# Patient Record
Sex: Female | Born: 1990
Health system: Southern US, Community
[De-identification: ages and names within clinical notes are randomized; demographics above are authoritative.]

## PROBLEM LIST (undated history)

## (undated) DIAGNOSIS — J45909 Unspecified asthma, uncomplicated: Secondary | ICD-10-CM

## (undated) DIAGNOSIS — E0590 Thyrotoxicosis, unspecified without thyrotoxic crisis or storm: Secondary | ICD-10-CM

## (undated) HISTORY — PX: TONSILLECTOMY: SUR1361

## (undated) HISTORY — PX: WISDOM TOOTH EXTRACTION: SHX21

## (undated) HISTORY — DX: Unspecified asthma, uncomplicated: J45.909

---

## 2017-10-20 DIAGNOSIS — R52 Pain, unspecified: Secondary | ICD-10-CM | POA: Diagnosis not present

## 2018-04-05 DIAGNOSIS — Z01419 Encounter for gynecological examination (general) (routine) without abnormal findings: Secondary | ICD-10-CM | POA: Diagnosis not present

## 2018-08-04 DIAGNOSIS — Z3169 Encounter for other general counseling and advice on procreation: Secondary | ICD-10-CM | POA: Diagnosis not present

## 2018-10-14 ENCOUNTER — Ambulatory Visit (INDEPENDENT_AMBULATORY_CARE_PROVIDER_SITE_OTHER): Payer: BLUE CROSS/BLUE SHIELD | Admitting: Physician Assistant

## 2018-10-14 ENCOUNTER — Ambulatory Visit (INDEPENDENT_AMBULATORY_CARE_PROVIDER_SITE_OTHER): Payer: BLUE CROSS/BLUE SHIELD

## 2018-10-14 ENCOUNTER — Encounter: Payer: Self-pay | Admitting: Physician Assistant

## 2018-10-14 ENCOUNTER — Other Ambulatory Visit: Payer: Self-pay

## 2018-10-14 VITALS — BP 102/67 | HR 67 | Ht 67.0 in | Wt 141.0 lb

## 2018-10-14 DIAGNOSIS — J45990 Exercise induced bronchospasm: Secondary | ICD-10-CM | POA: Diagnosis not present

## 2018-10-14 DIAGNOSIS — Z7689 Persons encountering health services in other specified circumstances: Secondary | ICD-10-CM | POA: Diagnosis not present

## 2018-10-14 DIAGNOSIS — Z23 Encounter for immunization: Secondary | ICD-10-CM

## 2018-10-14 DIAGNOSIS — R2241 Localized swelling, mass and lump, right lower limb: Secondary | ICD-10-CM

## 2018-10-14 DIAGNOSIS — L539 Erythematous condition, unspecified: Secondary | ICD-10-CM | POA: Diagnosis not present

## 2018-10-14 NOTE — Assessment & Plan Note (Addendum)
This is been present for years, on and off even will become extremely erythematous, swollen without much pain. She has been admitted for this in the past she has had courses of antibiotics without much improvement. This is definitely not a paronychia. I do think that this is more of a vascular phenomenon, possibly a subcutaneous vascular hemangioma. Adding an x-ray, and an MRI of the right third toe with IV contrast.

## 2018-10-14 NOTE — Patient Instructions (Signed)
Vascular Malformation    A vascular malformation is a defect in the development of a blood or lymph vessel. Vascular malformations are present at birth, but they may not cause signs or symptoms until years later. Vascular malformations can occur anywhere that blood or lymph circulates in your body. Lymph is fluid that is part of the body's disease-fighting (immune) system.  There are four types of vascular malformations.  Venous malformations  These affect the veins, which are blood vessels that carry blood back to the heart. These are the most common type.  Arteriovenous malformations  These involve veins and arteries. Arteries are blood vessels that carry oxygen-rich blood away from the heart.  Lymphatic malformations  These affect lymph vessels. These vessels carry fluid through the body that helps to fight infections.  Capillary malformations  These affect capillaries, which are tiny blood vessels that connect veins to arteries.  What are the causes?  This condition is caused by an error in development that occurs before birth. In most cases, the cause of the error is not known. In some cases, it may be caused by a change in a gene (mutation). These mutations can be passed down through families (inherited).  What are the signs or symptoms?  The main symptom of this condition is a skin blemish or lump that swells, bleeds, or is painful. Other signs and symptoms depend on the type, location, and size of the malformation.   Venous malformations. These commonly appear on the face, arms, legs, or the area between the neck and groin (torso). They may have a bluish appearance.   Arteriovenous malformations. These have different symptoms depending on where they grow in the body.  ? Brain malformations may cause a headache or seizure. They can also cause weakness, dizziness, confusion, loss of speech, or other symptoms of a bleeding (hemorrhagic) stroke. These malformations can be life-threatening.  ? Spinal cord  malformations may cause back pain, weakness, or inability to feel or move (paralysis).  ? Chest malformations may cause shortness of breath, fatigue, a cough, or coughing up blood.  ? Malformations in the head or neck may cause vision changes or difficulty with breathing or swallowing.   Lymphatic malformations. These cause swelling on the face and neck.   Capillary malformations. These may look like a purple stain (port-wine stain) on the face. Over time, the stain may become darker and the skin may become thicker.  How is this diagnosed?  This condition may be diagnosed based on:   A physical exam. Some malformations can be seen or felt on the body.   Tests that create detailed images of the body. These may include:  ? Ultrasound. This test uses sound waves to make images.  ? MRI. This test is used to diagnose deep vascular malformations.  ? CT scan. This uses X-rays to make a three-dimensional image.  ? Angiogram. This is a type of X-ray that is taken after a dye is injected into an artery. The dye travels to the malformation. This imaging is most important for diagnosing arteriovenous malformations.  How is this treated?  Treatment depends on the type, size, and location of the malformation as well as on any symptoms. In some cases, treatment may not be needed. The malformation will be checked often to see if it is growing. If treatment is needed, the options include:   Laser treatment. This may be used to treat malformations near the skin. This is often the best treatment for capillary malformations.     Embolization. This treatment uses a glue substance or a particle that is inserted into a blood vessel that supplies a malformation. This blocks blood flow and shrinks the malformation.   Sclerotherapy. This involves inserting an irritating substance into a blood vessel or lymphatic vessel. This causes the vessel to shrink and become a scar.   Radiosurgery. This is an X-ray treatment to destroy a specific  spot or to shrink a malformation. You may have this treatment to treat arteriovenous malformations in the brain or spinal cord.   Surgery. Some vascular malformations can be removed surgically. You may have other treatments to shrink the malformation before surgery.  Follow these instructions at home:   Take over-the-counter and prescription medicines only as told by your health care provider.   Return to your normal activities as told by your health care provider. Ask your health care provider what activities are safe for you.   Keep all follow-up visits as told by your health care provider. This is important.  Contact a health care provider if:   You have a fever.   You have pain.   You have a vascular malformation near your skin that changes size, bleeds, or becomes painful.   You have difficulty swallowing.   You have a headache, backache, or loss of feeling (numbness).   You have a cough.   You have dizziness, weakness, or confusion.  Get help right away if:     You have trouble breathing.   You have severe bleeding.   You cough up blood.   You have a severe headache or back pain.   You have loss of movement.   You have any symptoms of a stroke. "BE FAST" is an easy way to remember the main warning signs of a stroke:  ? B - Balance. Signs are dizziness, sudden trouble walking, or loss of balance.  ? E - Eyes. Signs are trouble seeing or a sudden change in vision.  ? F - Face. Signs are sudden weakness or numbness of the face, or the face or eyelid drooping on one side.  ? A - Arms. Signs are weakness or numbness in an arm. This happens suddenly and usually on one side of the body.  ? S - Speech. Signs are sudden trouble speaking, slurred speech, or trouble understanding what people say.  ? T - Time. Time to call emergency services. Write down what time symptoms started.   You have other signs of a stroke, such as:  ? A sudden, severe headache with no known cause.  ? Nausea or  vomiting.  ? Seizure.  Summary   A vascular malformation is an abnormal development of a blood or lymph vessel that you are born with.   Symptoms depend on the type, size, and location of the malformation that you have.   You might not develop symptoms for many years.   Imaging studies are needed to diagnose vascular malformations inside the body.   There are many options for treatment. The best treatment will depend on the type, size, symptoms, and location of the malformation.  This information is not intended to replace advice given to you by your health care provider. Make sure you discuss any questions you have with your health care provider.  Document Released: 04/15/2017 Document Revised: 04/15/2017 Document Reviewed: 04/15/2017  Elsevier Interactive Patient Education  2019 Elsevier Inc[employer]

## 2018-10-14 NOTE — Progress Notes (Signed)
Subjective:    I'm seeing this patient as a consultation for: Gena Fray, PA-C  CC: Toe swelling  HPI: For years this pleasant 28 year old female has had swelling in her right third toe, she has been in the hospital, had antibiotics several times.  For the most part, swelling and redness is somewhat intermittent.  Symptoms are moderate, persistent, localized without radiation.  There is absolutely no pain.  I reviewed the past medical history, family history, social history, surgical history, and allergies today and no changes were needed.  Please see the problem list section below in epic for further details.  Past Medical History: Past Medical History:  Diagnosis Date  . Asthma    Past Surgical History: Past Surgical History:  Procedure Laterality Date  . TONSILLECTOMY     Social History: Social History   Socioeconomic History  . Marital status: Married    Spouse name: Not on file  . Number of children: Not on file  . Years of education: Not on file  . Highest education level: Not on file  Occupational History  . Not on file  Social Needs  . Financial resource strain: Not on file  . Food insecurity:    Worry: Not on file    Inability: Not on file  . Transportation needs:    Medical: Not on file    Non-medical: Not on file  Tobacco Use  . Smoking status: Never Smoker  . Smokeless tobacco: Never Used  Substance and Sexual Activity  . Alcohol use: Yes    Alcohol/week: 4.0 standard drinks    Types: 4 Standard drinks or equivalent per week  . Drug use: Never  . Sexual activity: Yes    Birth control/protection: None    Comment: planning on pregnancy 10/2018  Lifestyle  . Physical activity:    Days per week: Not on file    Minutes per session: Not on file  . Stress: Not on file  Relationships  . Social connections:    Talks on phone: Not on file    Gets together: Not on file    Attends religious service: Not on file    Active member of club or  organization: Not on file    Attends meetings of clubs or organizations: Not on file    Relationship status: Not on file  Other Topics Concern  . Not on file  Social History Narrative  . Not on file   Family History: Family History  Problem Relation Age of Onset  . Heart attack Mother    Allergies: No Known Allergies Medications: See med rec.  Review of Systems: No headache, visual changes, nausea, vomiting, diarrhea, constipation, dizziness, abdominal pain, skin rash, fevers, chills, night sweats, weight loss, swollen lymph nodes, body aches, joint swelling, muscle aches, chest pain, shortness of breath, mood changes, visual or auditory hallucinations.   Objective:   General: Well Developed, well nourished, and in no acute distress.  Neuro:  Extra-ocular muscles intact, able to move all 4 extremities, sensation grossly intact.  Deep tendon reflexes tested were normal. Psych: Alert and oriented, mood congruent with affect. ENT:  Ears and nose appear unremarkable.  Hearing grossly normal. Neck: Unremarkable overall appearance, trachea midline.  No visible thyroid enlargement. Eyes: Conjunctivae and lids appear unremarkable.  Pupils equal and round. Skin: Warm and dry, no rashes noted.  Cardiovascular: Pulses palpable, no extremity edema.      Impression and Recommendations:   This case required medical decision making of moderate complexity.  Mass of right third toe This is been present for years, on and off even will become extremely erythematous, swollen without much pain. She has been admitted for this in the past she has had courses of antibiotics without much improvement. This is definitely not a paronychia. I do think that this is more of a vascular phenomenon, possibly a subcutaneous vascular hemangioma. Adding an x-ray, and an MRI of the right third toe with IV contrast.   ___________________________________________ Ihor Austin. Benjamin Stain, M.D., ABFM., CAQSM.  Primary Care and Sports Medicine [facility]  Adjunct Professor of Family Medicine  [employer]

## 2018-10-14 NOTE — Progress Notes (Signed)
HPI:                                                                Veronica Figueroa is a 28 y.o. female who presents to [facility]: Primary Care Sports Medicine today to establish care  Current concerns: toe swelling  Beginning in 2015 she had right 3rd toe discoloration and pain. States she was hospitalized for 3 days, received antibiotics and had some scans, but states she never got any answers at the time. She hasn't had any issues since then until about 3 months ago when toe began becoming red again  Asthma:  reports this is exercise-induced. Has not used rescue inhaler in the last year.  Reports she does not do much vigorous exercise to really trigger her asthma.  Denies nighttime coughing or wheezing.  Denies any asthma exacerbations or hospitalizations.   Depression screen [facility] 2/9 10/14/2018  Decreased Interest 0  Down, Depressed, Hopeless 0  PHQ - 2 Score 0    No flowsheet data found.    Past Medical History:  Diagnosis Date  . Asthma    Past Surgical History:  Procedure Laterality Date  . TONSILLECTOMY     Social History   Tobacco Use  . Smoking status: Never Smoker  . Smokeless tobacco: Never Used  Substance Use Topics  . Alcohol use: Yes    Alcohol/week: 4.0 standard drinks    Types: 4 Standard drinks or equivalent per week   family history includes Heart attack in her mother.    ROS: negative except as noted in the HPI  Medications: Current Outpatient Medications  Medication Sig Dispense Refill  . albuterol (PROVENTIL HFA;VENTOLIN HFA) 108 (90 Base) MCG/ACT inhaler Inhale into the lungs every 6 (six) hours as needed.    [name] CALCIUM PO Take by mouth.    . Omega-3 Fatty Acids (FISH OIL PO) Take by mouth.    . Prenatal Vit-Fe Fumarate-FA (PRENATAL PO) Take by mouth.    . Probiotic Product (PROBIOTIC PO) Take by mouth.    [name] VITAMIN D PO Take by mouth.     No current facility-administered medications for this visit.    No Known  Allergies     Objective:  BP 102/67   Pulse 67   Ht 5\' 7"  (1.702 m)   Wt 141 lb (64 kg)   LMP 10/07/2018   BMI 22.08 kg/m  Gen:  alert, not ill-appearing, no distress, appropriate for age HEENT: head normocephalic without obvious abnormality, conjunctiva and cornea clear, trachea midline Pulm: Normal work of breathing, normal phonation, clear to auscultation bilaterally Neuro: alert and oriented x 3, no tremor MSK: extremities atraumatic, normal gait and station Skin: distal right third toe there is redness and edema of the nail folds that is nontender Psych: well-groomed, cooperative, good eye contact, euthymic mood, affect mood-congruent, speech is articulate, and thought processes clear and goal-directed    No results found for this or any previous visit (from the past 72 hour(s)). No results found.    Assessment and Plan: 28 y.o. female with   .Veronica Figueroa was seen today for establish care.  Diagnoses and all orders for this visit:  Encounter to establish care  Mass of right third toe -     DG Toe  3rd Right -     MR TOES RIGHT W WO CONTRAST; Future  Exercise-induced asthma  Need for Tdap vaccination -     Tdap vaccine greater than or equal to 7yo IM    - Personally reviewed PMH, PSH, PFH, medications, allergies, HM - Age-appropriate cancer screening: Pap smear up-to-date per patient, unable to request records as patient cannot recall where this was performed - Influenza declined - Tdap given in office today - PHQ2 negative - She is planning on becoming pregnant within the next year.  She recently completed preconception counseling with gynecology Dr. Loreta [facility])  Right third toe redness and swelling On initial exam appeared like a paronychia.  Due to her history of hospitalization and unclear possible history of osteomyelitis I consulted sports medicine today.  See assessment and plan note.  Exercised induced bronchospasm Well controlled Cont  Albuterol prn   Patient education and anticipatory guidance given Patient agrees with treatment plan Follow-up as needed if symptoms worsen or fail to improve  Levonne Hubert PA-C

## 2018-10-19 ENCOUNTER — Telehealth: Payer: Self-pay | Admitting: Physician Assistant

## 2018-10-19 DIAGNOSIS — N926 Irregular menstruation, unspecified: Secondary | ICD-10-CM

## 2018-10-19 NOTE — Telephone Encounter (Signed)
Pt advised.

## 2018-10-19 NOTE — Telephone Encounter (Signed)
Pt scheduled for MRI on 10/31/18. Her LMP was on 10/07/18. She is concerned about the contrast since her and her husband are trying to get pregnant. Blood test pended for PCP review, that appears to be the best option to determine if she is pregnant prior to the scan. Unsure if such an early result would show on urine test.

## 2018-10-21 ENCOUNTER — Encounter: Payer: Self-pay | Admitting: Physician Assistant

## 2018-10-23 ENCOUNTER — Telehealth: Payer: Self-pay | Admitting: Physician Assistant

## 2018-10-23 DIAGNOSIS — R2241 Localized swelling, mass and lump, right lower limb: Secondary | ICD-10-CM

## 2018-10-23 NOTE — Telephone Encounter (Signed)
MRI was cancelled. Do you want to reorder this as an ASAP exam? Routing.  

## 2018-10-24 NOTE — Telephone Encounter (Signed)
This is not an urgent MRI Routed to [facility] since he is the ordering physician

## 2018-10-24 NOTE — Telephone Encounter (Signed)
Yes, this is a mass/neoplasm, I think we need a diagnosis sooner rather than later.  Would you please change the urgency to ASAP?

## 2018-10-25 NOTE — Addendum Note (Signed)
Addended by: Sandrea Matte on: 10/25/2018 09:56 AM   Modules accepted: Orders

## 2018-10-25 NOTE — Telephone Encounter (Signed)
Order updated

## 2018-10-31 ENCOUNTER — Other Ambulatory Visit: Payer: BLUE CROSS/BLUE SHIELD

## 2018-11-07 ENCOUNTER — Other Ambulatory Visit: Payer: BLUE CROSS/BLUE SHIELD

## 2018-12-28 DIAGNOSIS — O3680X0 Pregnancy with inconclusive fetal viability, not applicable or unspecified: Secondary | ICD-10-CM | POA: Diagnosis not present

## 2018-12-28 DIAGNOSIS — Z3682 Encounter for antenatal screening for nuchal translucency: Secondary | ICD-10-CM | POA: Diagnosis not present

## 2018-12-28 DIAGNOSIS — Z3A01 Less than 8 weeks gestation of pregnancy: Secondary | ICD-10-CM | POA: Diagnosis not present

## 2018-12-28 DIAGNOSIS — Z3689 Encounter for other specified antenatal screening: Secondary | ICD-10-CM | POA: Diagnosis not present

## 2018-12-28 DIAGNOSIS — Z363 Encounter for antenatal screening for malformations: Secondary | ICD-10-CM | POA: Diagnosis not present

## 2018-12-28 DIAGNOSIS — Z3490 Encounter for supervision of normal pregnancy, unspecified, unspecified trimester: Secondary | ICD-10-CM | POA: Insufficient documentation

## 2019-02-01 DIAGNOSIS — Z3481 Encounter for supervision of other normal pregnancy, first trimester: Secondary | ICD-10-CM | POA: Diagnosis not present

## 2019-02-01 DIAGNOSIS — Z3682 Encounter for antenatal screening for nuchal translucency: Secondary | ICD-10-CM | POA: Diagnosis not present

## 2019-02-01 DIAGNOSIS — Z3A12 12 weeks gestation of pregnancy: Secondary | ICD-10-CM | POA: Diagnosis not present

## 2019-02-02 DIAGNOSIS — M549 Dorsalgia, unspecified: Secondary | ICD-10-CM | POA: Diagnosis not present

## 2019-02-02 DIAGNOSIS — Z3A12 12 weeks gestation of pregnancy: Secondary | ICD-10-CM | POA: Diagnosis not present

## 2019-02-02 DIAGNOSIS — O9989 Other specified diseases and conditions complicating pregnancy, childbirth and the puerperium: Secondary | ICD-10-CM | POA: Diagnosis not present

## 2019-03-02 DIAGNOSIS — Z3A16 16 weeks gestation of pregnancy: Secondary | ICD-10-CM | POA: Diagnosis not present

## 2019-03-02 DIAGNOSIS — Z3689 Encounter for other specified antenatal screening: Secondary | ICD-10-CM | POA: Insufficient documentation

## 2019-03-29 DIAGNOSIS — Z363 Encounter for antenatal screening for malformations: Secondary | ICD-10-CM | POA: Diagnosis not present

## 2019-03-29 DIAGNOSIS — Z3400 Encounter for supervision of normal first pregnancy, unspecified trimester: Secondary | ICD-10-CM | POA: Diagnosis not present

## 2019-03-29 DIAGNOSIS — Z3402 Encounter for supervision of normal first pregnancy, second trimester: Secondary | ICD-10-CM | POA: Diagnosis not present

## 2019-03-29 LAB — OB RESULTS CONSOLE RUBELLA ANTIBODY, IGM: Rubella: IMMUNE

## 2019-03-29 LAB — OB RESULTS CONSOLE GC/CHLAMYDIA
Chlamydia: NEGATIVE
Gonorrhea: NEGATIVE

## 2019-03-29 LAB — OB RESULTS CONSOLE ABO/RH: RH Type: POSITIVE

## 2019-03-29 LAB — OB RESULTS CONSOLE ANTIBODY SCREEN: Antibody Screen: NEGATIVE

## 2019-03-29 LAB — OB RESULTS CONSOLE RPR: RPR: NONREACTIVE

## 2019-03-29 LAB — OB RESULTS CONSOLE HIV ANTIBODY (ROUTINE TESTING): HIV: NONREACTIVE

## 2019-03-29 LAB — OB RESULTS CONSOLE HEPATITIS B SURFACE ANTIGEN: Hepatitis B Surface Ag: NEGATIVE

## 2019-04-06 DIAGNOSIS — Z363 Encounter for antenatal screening for malformations: Secondary | ICD-10-CM | POA: Diagnosis not present

## 2019-04-06 DIAGNOSIS — Z3402 Encounter for supervision of normal first pregnancy, second trimester: Secondary | ICD-10-CM | POA: Diagnosis not present

## 2019-05-01 DIAGNOSIS — Z3402 Encounter for supervision of normal first pregnancy, second trimester: Secondary | ICD-10-CM | POA: Diagnosis not present

## 2019-05-25 DIAGNOSIS — Z3403 Encounter for supervision of normal first pregnancy, third trimester: Secondary | ICD-10-CM | POA: Diagnosis not present

## 2019-05-25 DIAGNOSIS — Z3689 Encounter for other specified antenatal screening: Secondary | ICD-10-CM | POA: Diagnosis not present

## 2019-06-12 DIAGNOSIS — Z3403 Encounter for supervision of normal first pregnancy, third trimester: Secondary | ICD-10-CM | POA: Diagnosis not present

## 2019-06-26 DIAGNOSIS — Z3403 Encounter for supervision of normal first pregnancy, third trimester: Secondary | ICD-10-CM | POA: Diagnosis not present

## 2019-07-17 DIAGNOSIS — Z1151 Encounter for screening for human papillomavirus (HPV): Secondary | ICD-10-CM | POA: Diagnosis not present

## 2019-07-17 DIAGNOSIS — Z3685 Encounter for antenatal screening for Streptococcus B: Secondary | ICD-10-CM | POA: Diagnosis not present

## 2019-07-17 DIAGNOSIS — Z3403 Encounter for supervision of normal first pregnancy, third trimester: Secondary | ICD-10-CM | POA: Diagnosis not present

## 2019-07-26 DIAGNOSIS — Z3403 Encounter for supervision of normal first pregnancy, third trimester: Secondary | ICD-10-CM | POA: Diagnosis not present

## 2019-08-03 DIAGNOSIS — Z3403 Encounter for supervision of normal first pregnancy, third trimester: Secondary | ICD-10-CM | POA: Diagnosis not present

## 2019-08-03 LAB — OB RESULTS CONSOLE GBS: GBS: POSITIVE

## 2019-08-04 NOTE — L&D Delivery Note (Signed)
Delivery Note  First Stage: Labor onset: 0300 Augmentation : none Analgesia /Anesthesia intrapartum: epidural SROM at 0300 - clear fluid  Second Stage: Complete dilation at 1618 Onset of pushing at 1622 FHR second stage Category 2  Pt. Pushed in lateral and lithotomy positions and progressed well until +4 station.  She remained at +4 to +5 station for about 40 minutes. Pt. Had a tight perineal body and making minimal progress. I  recommended episiotomy to aid in delivery and pt. Agreed. 1% Lidocaine injected. Small, slightly right median episiotomy performed and fetal head was immediately delivered. Delivery of a viable, healthy female "Merrin" at [employer] by Carlean Jews, CNM in ROA position No nuchal cord Cord double clamped after cessation of pulsation, cut by FOB Cord blood sample collected   Third Stage: Placenta delivered via Tomasa Blase intact with 3 VC @ 1808 Placenta disposition: home with patient Uterine tone firm with massage and IV Pitocin bolus. Several clots removed from lower uterine segment   Episiotomy: Median 2nd degree perineal laceration, no extension Anesthesia for repair: epidural and 1% Lidocaine Repair : 2.0 vicryl  Est. Blood Loss (mL): 178 mL  Complications: none  Mom to postpartum.  Baby to Couplet care / Skin to Skin.  Newborn: Birth Weight: 7#7.9oz (3399oz)  Apgar Scores: 9, 9  Feeding planned: breast  Carlean Jews, MSN, CNM [facility]

## 2019-08-11 DIAGNOSIS — Z3403 Encounter for supervision of normal first pregnancy, third trimester: Secondary | ICD-10-CM | POA: Diagnosis not present

## 2019-08-15 ENCOUNTER — Encounter (HOSPITAL_COMMUNITY): Payer: Self-pay | Admitting: Obstetrics and Gynecology

## 2019-08-15 ENCOUNTER — Inpatient Hospital Stay (HOSPITAL_COMMUNITY): Payer: BC Managed Care – PPO | Admitting: Anesthesiology

## 2019-08-15 ENCOUNTER — Inpatient Hospital Stay (HOSPITAL_COMMUNITY)
Admission: AD | Admit: 2019-08-15 | Discharge: 2019-08-17 | DRG: 806 | Disposition: A | Discharge status: Home / Self Care | Payer: BC Managed Care – PPO | Attending: Obstetrics | Admitting: Obstetrics

## 2019-08-15 ENCOUNTER — Other Ambulatory Visit: Payer: Self-pay

## 2019-08-15 DIAGNOSIS — O9081 Anemia of the puerperium: Secondary | ICD-10-CM | POA: Diagnosis not present

## 2019-08-15 DIAGNOSIS — Z20822 Contact with and (suspected) exposure to covid-19: Secondary | ICD-10-CM | POA: Diagnosis present

## 2019-08-15 DIAGNOSIS — D62 Acute posthemorrhagic anemia: Secondary | ICD-10-CM | POA: Diagnosis not present

## 2019-08-15 DIAGNOSIS — Z3A40 40 weeks gestation of pregnancy: Secondary | ICD-10-CM | POA: Diagnosis not present

## 2019-08-15 DIAGNOSIS — O26893 Other specified pregnancy related conditions, third trimester: Secondary | ICD-10-CM | POA: Diagnosis not present

## 2019-08-15 DIAGNOSIS — J45909 Unspecified asthma, uncomplicated: Secondary | ICD-10-CM | POA: Diagnosis not present

## 2019-08-15 DIAGNOSIS — O99824 Streptococcus B carrier state complicating childbirth: Principal | ICD-10-CM | POA: Diagnosis present

## 2019-08-15 DIAGNOSIS — O9952 Diseases of the respiratory system complicating childbirth: Secondary | ICD-10-CM | POA: Diagnosis present

## 2019-08-15 LAB — SARS CORONAVIRUS 2 (TAT 6-24 HRS): SARS Coronavirus 2: NEGATIVE

## 2019-08-15 LAB — TYPE AND SCREEN
ABO/RH(D): A POS
Antibody Screen: NEGATIVE

## 2019-08-15 LAB — CBC
HCT: 42.1 % (ref 36.0–46.0)
Hemoglobin: 14.4 g/dL (ref 12.0–15.0)
MCH: 32.7 pg (ref 26.0–34.0)
MCHC: 34.2 g/dL (ref 30.0–36.0)
MCV: 95.7 fL (ref 80.0–100.0)
Platelets: 188 10*3/uL (ref 150–400)
RBC: 4.4 MIL/uL (ref 3.87–5.11)
RDW: 12 % (ref 11.5–15.5)
WBC: 11 10*3/uL — ABNORMAL HIGH (ref 4.0–10.5)
nRBC: 0 % (ref 0.0–0.2)

## 2019-08-15 LAB — ABO/RH: ABO/RH(D): A POS

## 2019-08-15 LAB — POCT FERN TEST: POCT Fern Test: POSITIVE

## 2019-08-15 LAB — RPR: RPR Ser Ql: NONREACTIVE

## 2019-08-15 MED ORDER — DIPHENHYDRAMINE HCL 25 MG PO CAPS: 25.0000 mg | ORAL_CAPSULE | Freq: Four times a day (QID) | ORAL | Status: DC | PRN

## 2019-08-15 MED ORDER — EPHEDRINE 5 MG/ML INJ: 10.0000 mg | INTRAVENOUS | Status: DC | PRN

## 2019-08-15 MED ORDER — DIBUCAINE (PERIANAL) 1 % EX OINT: 1.0000 "application " | TOPICAL_OINTMENT | CUTANEOUS | Status: DC | PRN

## 2019-08-15 MED ORDER — FENTANYL-BUPIVACAINE-NACL 0.5-0.125-0.9 MG/250ML-% EP SOLN
12.0000 mL/h | EPIDURAL | Status: DC | PRN
  Filled 2019-08-15: qty 250

## 2019-08-15 MED ORDER — PENICILLIN G POT IN DEXTROSE 60000 UNIT/ML IV SOLN
3.0000 10*6.[IU] | INTRAVENOUS | Status: DC
  Administered 2019-08-15 (×2): 3 10*6.[IU] via INTRAVENOUS
  Filled 2019-08-15 (×2): qty 50

## 2019-08-15 MED ORDER — COCONUT OIL OIL: 1.0000 "application " | TOPICAL_OIL | Status: DC | PRN

## 2019-08-15 MED ORDER — SODIUM CHLORIDE 0.9 % IV SOLN: 250.0000 mL | INTRAVENOUS | Status: DC | PRN

## 2019-08-15 MED ORDER — DIPHENHYDRAMINE HCL 50 MG/ML IJ SOLN: 12.5000 mg | INTRAMUSCULAR | Status: DC | PRN

## 2019-08-15 MED ORDER — SODIUM CHLORIDE (PF) 0.9 % IJ SOLN
INTRAMUSCULAR | Status: DC | PRN
  Administered 2019-08-15: 12 mL/h via EPIDURAL

## 2019-08-15 MED ORDER — SOD CITRATE-CITRIC ACID 500-334 MG/5ML PO SOLN: 30.0000 mL | ORAL | Status: DC | PRN

## 2019-08-15 MED ORDER — LACTATED RINGERS IV SOLN
500.0000 mL | Freq: Once | INTRAVENOUS | Status: AC
  Administered 2019-08-15: 500 mL via INTRAVENOUS

## 2019-08-15 MED ORDER — METHYLERGONOVINE MALEATE 0.2 MG/ML IJ SOLN: 0.2000 mg | INTRAMUSCULAR | Status: DC | PRN

## 2019-08-15 MED ORDER — SODIUM CHLORIDE 0.9 % IV SOLN
5.0000 10*6.[IU] | Freq: Once | INTRAVENOUS | Status: AC
  Administered 2019-08-15: 09:00:00 5 10*6.[IU] via INTRAVENOUS
  Filled 2019-08-15: qty 5

## 2019-08-15 MED ORDER — ONDANSETRON HCL 4 MG PO TABS: 4.0000 mg | ORAL_TABLET | ORAL | Status: DC | PRN

## 2019-08-15 MED ORDER — LIDOCAINE HCL (PF) 1 % IJ SOLN
30.0000 mL | INTRAMUSCULAR | Status: AC | PRN
  Administered 2019-08-15: 30 mL via SUBCUTANEOUS
  Filled 2019-08-15: qty 30

## 2019-08-15 MED ORDER — IBUPROFEN 600 MG PO TABS
600.0000 mg | ORAL_TABLET | Freq: Four times a day (QID) | ORAL | Status: DC
  Administered 2019-08-15 – 2019-08-17 (×7): 600 mg via ORAL
  Filled 2019-08-15 (×7): qty 1

## 2019-08-15 MED ORDER — ONDANSETRON HCL 4 MG/2ML IJ SOLN: 4.0000 mg | INTRAMUSCULAR | Status: DC | PRN

## 2019-08-15 MED ORDER — ONDANSETRON HCL 4 MG/2ML IJ SOLN: 4.0000 mg | Freq: Four times a day (QID) | INTRAMUSCULAR | Status: DC | PRN

## 2019-08-15 MED ORDER — ALBUTEROL SULFATE (2.5 MG/3ML) 0.083% IN NEBU: 2.5000 mg | INHALATION_SOLUTION | Freq: Four times a day (QID) | RESPIRATORY_TRACT | Status: DC | PRN

## 2019-08-15 MED ORDER — OXYTOCIN BOLUS FROM INFUSION
500.0000 mL | Freq: Once | INTRAVENOUS | Status: AC
  Administered 2019-08-15: 500 mL via INTRAVENOUS

## 2019-08-15 MED ORDER — LACTATED RINGERS IV SOLN: INTRAVENOUS | Status: DC

## 2019-08-15 MED ORDER — PRENATAL MULTIVITAMIN CH
1.0000 | ORAL_TABLET | Freq: Every day | ORAL | Status: DC
  Administered 2019-08-16 – 2019-08-17 (×2): 1 via ORAL
  Filled 2019-08-15 (×2): qty 1

## 2019-08-15 MED ORDER — SIMETHICONE 80 MG PO CHEW: 80.0000 mg | CHEWABLE_TABLET | ORAL | Status: DC | PRN

## 2019-08-15 MED ORDER — ZOLPIDEM TARTRATE 5 MG PO TABS: 5.0000 mg | ORAL_TABLET | Freq: Every evening | ORAL | Status: DC | PRN

## 2019-08-15 MED ORDER — SENNOSIDES-DOCUSATE SODIUM 8.6-50 MG PO TABS
2.0000 | ORAL_TABLET | ORAL | Status: DC
  Administered 2019-08-15 – 2019-08-17 (×2): 2 via ORAL
  Filled 2019-08-15 (×2): qty 2

## 2019-08-15 MED ORDER — ACETAMINOPHEN 325 MG PO TABS
650.0000 mg | ORAL_TABLET | ORAL | Status: DC | PRN
  Administered 2019-08-16 (×2): 650 mg via ORAL
  Filled 2019-08-15 (×2): qty 2

## 2019-08-15 MED ORDER — TETANUS-DIPHTH-ACELL PERTUSSIS 5-2.5-18.5 LF-MCG/0.5 IM SUSP: 0.5000 mL | Freq: Once | INTRAMUSCULAR | Status: DC

## 2019-08-15 MED ORDER — LACTATED RINGERS IV SOLN: 500.0000 mL | INTRAVENOUS | Status: DC | PRN

## 2019-08-15 MED ORDER — LIDOCAINE-EPINEPHRINE (PF) 2 %-1:200000 IJ SOLN
INTRAMUSCULAR | Status: DC | PRN
  Administered 2019-08-15 (×2): 2 mL via EPIDURAL

## 2019-08-15 MED ORDER — BENZOCAINE-MENTHOL 20-0.5 % EX AERO
1.0000 "application " | INHALATION_SPRAY | CUTANEOUS | Status: DC | PRN
  Administered 2019-08-15: 1 via TOPICAL
  Filled 2019-08-15: qty 56

## 2019-08-15 MED ORDER — PHENYLEPHRINE 40 MCG/ML (10ML) SYRINGE FOR IV PUSH (FOR BLOOD PRESSURE SUPPORT): 80.0000 ug | PREFILLED_SYRINGE | INTRAVENOUS | Status: DC | PRN

## 2019-08-15 MED ORDER — SODIUM CHLORIDE 0.9% FLUSH: 3.0000 mL | INTRAVENOUS | Status: DC | PRN

## 2019-08-15 MED ORDER — SODIUM CHLORIDE 0.9% FLUSH: 3.0000 mL | Freq: Two times a day (BID) | INTRAVENOUS | Status: DC

## 2019-08-15 MED ORDER — METHYLERGONOVINE MALEATE 0.2 MG PO TABS: 0.2000 mg | ORAL_TABLET | ORAL | Status: DC | PRN

## 2019-08-15 MED ORDER — ACETAMINOPHEN 325 MG PO TABS: 650.0000 mg | ORAL_TABLET | ORAL | Status: DC | PRN

## 2019-08-15 MED ORDER — WITCH HAZEL-GLYCERIN EX PADS: 1.0000 "application " | MEDICATED_PAD | CUTANEOUS | Status: DC | PRN

## 2019-08-15 MED ORDER — OXYTOCIN 40 UNITS IN NORMAL SALINE INFUSION - SIMPLE MED
2.5000 [IU]/h | INTRAVENOUS | Status: DC
  Filled 2019-08-15: qty 1000

## 2019-08-15 NOTE — Progress Notes (Signed)
S:  Requesting cervical exam; contractions very painful, would like to know progress because she is considering epidural. Has been using Tens unit and alternating positions with some relief. Continuous labor support has been provided by CNM, doula, and spouse   O:  VS: Blood pressure 105/81, pulse 71, temperature 98.1 F (36.7 C), temperature source Oral, resp. rate 18, height 5\' 7"  (1.702 m), weight 76.2 kg, last menstrual period 11/07/2018, SpO2 100 %.        FHR : baseline 120 bpm/ variability moderate / accelerations +15x15 / occasional variable and early decelerations        Toco: contractions every 3 minutes / moderate         Cervix : Dilation: 5.5 Effacement (%): 100 Cervical Position: Middle Station: 0 Presentation: Vertex Exam by:: [email or web address] CNM        Membranes: clear fluid   A: Entering into active labor     FHR category 2    GBS positive - s/p 2 doses of PCN  P: Epidural now      Anticipate NSVD         Sharyl Nimrod, MSN, CNM [facility]

## 2019-08-15 NOTE — MAU Note (Signed)
.   Veronica Figueroa is a 29 y.o. at [redacted]w[redacted]d here in MAU reporting: contractions with LOF that started at 3 this morning. +FM  Onset of complaint:3 am Pain score: 5 Vitals:   08/15/19 0749  BP: 136/79  Pulse: 60  Resp: 18  Temp: (!) 97.3 F (36.3 C)  SpO2: 100%     FHT:120 Lab orders placed from triage:

## 2019-08-15 NOTE — H&P (Signed)
OB ADMISSION/ HISTORY & PHYSICAL:  Admission Date: 08/15/2019  7:33 AM  Admit Diagnosis: 40+1 weeks SROM in latent labor   Veronica Figueroa is a 29 y.o. female G1P0 at 40+1 weeks presenting for SROM for clear fluid.  Contractions started around 3am with an increase in mucus discharge and small about of leaking fluid, then noticed pad was wet at 5:30am with another gush of fluid around 6am.    Prenatal History: G1P0000   EDC : 08/14/2019, by Last Menstrual Period  Good prenatal care at [facility] until 20 weeks, then transferred to [facility] Provider: CNM management/M. Sigmon, CNM  Prenatal course complicated by - GBS positive  - Exercise induced asthma  Prenatal Labs: ABO, Rh: --/--/A POS (01/12 0800) Antibody: NEG (01/12 0800) Rubella: Immune (08/26 0000)  RPR: Nonreactive (08/26 0000)  HBsAg: Negative (08/26 0000)  HIV: Non-reactive (08/26 0000)  GTT: 60 GBS: Positive/-- (12/31 0000)  Panorama low risk, XX Declined AFP-1 CUS: normal XX anatomy, anterior placenta Tdap in March 2020  Medical / Surgical History :  Past medical history:  Past Medical History:  Diagnosis Date  . Asthma      Past surgical history:  Past Surgical History:  Procedure Laterality Date  . TONSILLECTOMY    . WISDOM TOOTH EXTRACTION      Family History:  Family History  Problem Relation Age of Onset  . Heart attack Mother      Social History:  reports that she has never smoked. She has never used smokeless tobacco. She reports current alcohol use of about 4.0 standard drinks of alcohol per week. She reports that she does not use drugs.   Allergies: Patient has no known allergies.    Current Medications at time of admission:  Prior to Admission medications   Medication Sig Start Date End Date Taking? Authorizing Provider  albuterol (PROVENTIL HFA;VENTOLIN HFA) 108 (90 Base) MCG/ACT inhaler Inhale into the lungs every 6 (six) hours as needed.   Yes [provider]  CALCIUM PO Take by mouth.   Yes [provider]  Omega-3 Fatty Acids (FISH OIL PO) Take by mouth.   Yes [provider]  Prenatal Vit-Fe Fumarate-FA (PRENATAL PO) Take by mouth.   Yes [provider]  Probiotic Product (PROBIOTIC PO) Take by mouth.   Yes [provider]  VITAMIN D PO Take by mouth.   Yes [provider]     Review of Systems: Active FM onset of ctx @ 0300 currently every 4 minutes LOF  / SROM @ 0300 No bloody show   Physical Exam:  VS: Blood pressure (!) 83/61, pulse 65, temperature 98.3 F (36.8 C), temperature source Oral, resp. rate 18, height 5\' 7"  (1.702 m), weight 76.2 kg, last menstrual period 11/07/2018, SpO2 100 %.  General: alert and oriented, pleasant, breathing well through contractions Heart: RRR Lungs: Clear lung fields Abdomen: Gravid, soft and non-tender, non-distended / uterus: non-tender, EFW by Leopold's 7#8 Extremities: trace  edema  Genitalia / VE: Dilation: 3 Effacement (%): 80, 70 Exam by:: Ginger Morris RN  FHR: baseline rate 120 bpm / variability moderate / accelerations + / no decelerations TOCO: every 3-4 minutes   Assessment: 40+[redacted] weeks gestation Latent stage of labor FHR category 1 GBS positive    Plan:  1. Admit to [facility]   - Routine labor and delivery orders   - Pain management: planning none, but analgesia/epidural if requested   - Desires low intervention, natural labor   -  Intermittent fetal monitoring per protocol   - Light laboring diet    - Doula and spouse at bedside  2. GBS Positive   - PCN prophylaxis  3. Postpartum:   - Breast 4. Anticipate MOD: NSVD   Dr. Ernestina Penna notified of admission / plan of care  Carlean Jews, MSN, [facility]

## 2019-08-15 NOTE — Progress Notes (Signed)
S:  Breathing well through ctxs. Using Tens unit and HK/side lying positions with peanut. Requesting cervical exam   O:  VS: Blood pressure 104/69, pulse 68, temperature 98.1 F (36.7 C), temperature source Oral, resp. rate 16, height 5\' 7"  (1.702 m), weight 76.2 kg, last menstrual period 11/07/2018, SpO2 100 %.        FHR : Intermittent        Toco: contractions every 01/07/2019 / moderate        Cervix : 4/90/-1 to 0        Membranes: clear fluid  A: Latent labor    Intermittent FHR reassuring     GBS positive - s/p 1 dose of PCN  P: Continuous labor support from spouse, CNM, doula     Continue frequent position changes     Working towards SVD    01-21-1991, MSN, CNM [facility]

## 2019-08-15 NOTE — Anesthesia Preprocedure Evaluation (Addendum)
Anesthesia Evaluation  Patient identified by MRN, date of birth, ID band Patient awake    Reviewed: Allergy & Precautions, NPO status , Patient's Chart, lab work & pertinent test results  Airway Mallampati: II  TM Distance: >3 FB Neck ROM: Full    Dental no notable dental hx.    Pulmonary asthma ,    Pulmonary exam normal breath sounds clear to auscultation       Cardiovascular negative cardio ROS Normal cardiovascular exam Rhythm:Regular Rate:Normal     Neuro/Psych negative neurological ROS  negative psych ROS   GI/Hepatic negative GI ROS, Neg liver ROS,   Endo/Other  negative endocrine ROS  Renal/GU negative Renal ROS  negative genitourinary   Musculoskeletal negative musculoskeletal ROS (+)   Abdominal   Peds  Hematology negative hematology ROS (+)   Anesthesia Other Findings   Reproductive/Obstetrics (+) Pregnancy                             Anesthesia Physical Anesthesia Plan  ASA: II  Anesthesia Plan: Epidural   Post-op Pain Management:    Induction:   PONV Risk Score and Plan: Treatment may vary due to age or medical condition  Airway Management Planned: Natural Airway  Additional Equipment:   Intra-op Plan:   Post-operative Plan:   Informed Consent: I have reviewed the patients History and Physical, chart, labs and discussed the procedure including the risks, benefits and alternatives for the proposed anesthesia with the patient or authorized representative who has indicated his/her understanding and acceptance.     Dental advisory given  Plan Discussed with: CRNA  Anesthesia Plan Comments: (Patient identified. Risks, benefits, options discussed with patient including but not limited to bleeding, infection, nerve damage, paralysis, failed block, incomplete pain control, headache, blood pressure changes, nausea, vomiting, reactions to medication, itching, and  post partum back pain. Confirmed with bedside nurse the patient's most recent platelet count. Confirmed with the patient that they are not taking any anticoagulation, have any bleeding history or any family history of bleeding disorders. Patient expressed understanding and wishes to proceed. All questions were answered. )        Anesthesia Quick Evaluation

## 2019-08-15 NOTE — Anesthesia Procedure Notes (Signed)
Epidural Patient location during procedure: OB Start time: 08/15/2019 1:20 PM End time: 08/15/2019 1:35 PM  Staffing Anesthesiologist: Elmer Picker, MD Performed: anesthesiologist   Preanesthetic Checklist Completed: patient identified, IV checked, risks and benefits discussed, monitors and equipment checked, pre-op evaluation and timeout performed  Epidural Patient position: sitting Prep: DuraPrep and site prepped and draped Patient monitoring: continuous pulse ox, blood pressure, heart rate and cardiac monitor Approach: midline Location: L3-L4 Injection technique: LOR air  Needle:  Needle type: Tuohy  Needle gauge: 17 G Needle length: 9 cm Needle insertion depth: 5 cm Catheter type: closed end flexible Catheter size: 19 Gauge Catheter at skin depth: 11 cm Test dose: negative  Assessment Sensory level: T8 Events: blood not aspirated, injection not painful, no injection resistance, no paresthesia and negative IV test  Additional Notes Patient identified. Risks/Benefits/Options discussed with patient including but not limited to bleeding, infection, nerve damage, paralysis, failed block, incomplete pain control, headache, blood pressure changes, nausea, vomiting, reactions to medication both or allergic, itching and postpartum back pain. Confirmed with bedside nurse the patient's most recent platelet count. Confirmed with patient that they are not currently taking any anticoagulation, have any bleeding history or any family history of bleeding disorders. Patient expressed understanding and wished to proceed. All questions were answered. Sterile technique was used throughout the entire procedure. Please see nursing notes for vital signs. Test dose was given through epidural catheter and negative prior to continuing to dose epidural or start infusion. Warning signs of high block given to the patient including shortness of breath, tingling/numbness in hands, complete motor block,  or any concerning symptoms with instructions to call for help. Patient was given instructions on fall risk and not to get out of bed. All questions and concerns addressed with instructions to call with any issues or inadequate analgesia.  Reason for block:procedure for pain

## 2019-08-15 NOTE — Progress Notes (Signed)
S:  More comfortable with epidural; feeling intermittent rectal and vaginal pressure   O:  VS: Blood pressure (!) 105/59, pulse 72, temperature 98.8 F (37.1 C), temperature source Oral, resp. rate 16, height 5\' 7"  (1.702 m), weight 76.2 kg, last menstrual period 11/07/2018, SpO2 100 %.        FHR : baseline 125 bpm / variability minimal to moderate / accelerations +15x15 / early and occasional variable decelerations        Toco: contractions every 2 minutes / moderate-strong  Cervix : Dilation: 7 Effacement (%): 100 Cervical Position: Middle Station: 0 Presentation: Vertex Exam by:: [email or web address] CNM        Membranes: clear fluid; bloody show  A: Active labor     FHR category 2    GBS positive - s/p 2 doses PCN  P: Rotate exaggerated SIMs with peanut      Anticipate NSVD    Sharyl Nimrod, MSN, CNM [facility]

## 2019-08-15 NOTE — Plan of Care (Signed)

## 2019-08-16 ENCOUNTER — Encounter (HOSPITAL_COMMUNITY): Payer: Self-pay | Admitting: Obstetrics

## 2019-08-16 LAB — CBC
HCT: 36.1 % (ref 36.0–46.0)
Hemoglobin: 12.4 g/dL (ref 12.0–15.0)
MCH: 33.2 pg (ref 26.0–34.0)
MCHC: 34.3 g/dL (ref 30.0–36.0)
MCV: 96.8 fL (ref 80.0–100.0)
Platelets: 171 10*3/uL (ref 150–400)
RBC: 3.73 MIL/uL — ABNORMAL LOW (ref 3.87–5.11)
RDW: 12.3 % (ref 11.5–15.5)
WBC: 18.7 10*3/uL — ABNORMAL HIGH (ref 4.0–10.5)
nRBC: 0 % (ref 0.0–0.2)

## 2019-08-16 NOTE — Lactation Note (Signed)
This note was copied from a baby's chart. Lactation Consultation Note Attempted to see mom, everyone sleeping.  Patient Name: Veronica Figueroa Date: 08/16/2019     Maternal Data    Feeding    LATCH Score                   Interventions    Lactation Tools Discussed/Used     Consult Status      Charyl Dancer 08/16/2019, 2:51 AM

## 2019-08-16 NOTE — Progress Notes (Signed)
PPD #1, SVD, median episiotomy, 2nd degree repair, baby girl "Merrin"  S:  Reports feeling tired and sore, but overall well and happy with experience             Tolerating po/ No nausea or vomiting / Denies dizziness or SOB; reports some soreness in throat/neck/chest - feels musculoskeletal             Bleeding is moderate             Pain controlled with Motrin             Up ad lib / ambulatory / voiding QS without difficulty   Newborn breast feeding - latching well; has been working with lactation; seeing colostrum with hand expression  O:               VS: BP 116/71 (BP Location: Right Arm)   Pulse 68   Temp 98.1 F (36.7 C) (Oral)   Resp 18   Ht 5\' 7"  (1.702 m)   Wt 76.2 kg   LMP 11/07/2018   SpO2 100%   Breastfeeding Unknown   BMI 26.31 kg/m    LABS:              Recent Labs    08/15/19 0830 08/16/19 0619  WBC 11.0* 18.7*  HGB 14.4 12.4  PLT 188 171               Blood type: --/--/A POS, A POS Performed at [facility], [address] [place], [place]  [phone]/12 [phone])  Rubella: Immune (08/26 0000)                     I&O: Intake/Output      01/12 0701 - 01/13 0700 01/13 0701 - 01/14 0700   Urine (mL/kg/hr) 100    Blood 252    Total Output 352    Net -352         Urine Occurrence 1 x                  Physical Exam:             Alert and oriented X3  Lungs: Clear and unlabored  Heart: regular rate and rhythm / no murmurs  Abdomen: soft, non-tender, non-distended              Fundus: firm, non-tender, U-1  Perineum: well approximated median episiotomy, mild edema, no erythema or ecchymosis  Lochia: scant rubra on pad; ice pack pad in place  Extremities: no edema, no calf pain or tenderness    A: PPD # 1, SVD  Median episiotomy repair   Mild ABL anemia    Doing well - stable status  P: Routine post partum orders  Continue working with lactation  Tucks pads PRN  Encouraged to rest when baby rests  May shower today  Anticipate d/c  home tomorrow   June, MSN, CNM [facility]

## 2019-08-16 NOTE — Anesthesia Postprocedure Evaluation (Signed)
Anesthesia Post Note  Patient: Veronica Figueroa  Procedure(s) Performed: AN AD HOC LABOR EPIDURAL     Patient location during evaluation: Mother Baby Anesthesia Type: Epidural Level of consciousness: awake and alert and oriented Pain management: satisfactory to patient Vital Signs Assessment: post-procedure vital signs reviewed and stable Respiratory status: spontaneous breathing and nonlabored ventilation Cardiovascular status: stable Postop Assessment: no headache, no backache, no signs of nausea or vomiting, adequate PO intake, patient able to bend at knees and able to ambulate (patient up walking) Anesthetic complications: no    Last Vitals:  Vitals:   08/16/19 0245 08/16/19 0645  BP: 106/74 116/71  Pulse:  68  Resp: 18   Temp: 36.9 C 36.7 C  SpO2: 98% 100%    Last Pain:  Vitals:   08/16/19 0820  TempSrc:   PainSc: 0-No pain   Pain Goal: Patients Stated Pain Goal: 5 (08/15/19 1256)              Epidural/Spinal Function Cutaneous sensation: Normal sensation (08/16/19 0820), Patient able to flex knees: Yes (08/16/19 0820), Patient able to lift hips off bed: Yes (08/16/19 0820), Back pain beyond tenderness at insertion site: No (08/16/19 0820), Progressively worsening motor and/or sensory loss: No (08/16/19 0820), Bowel and/or bladder incontinence post epidural: No (08/16/19 0820)  Madison Hickman

## 2019-08-16 NOTE — Discharge Instructions (Signed)
Sitz baths 2 times /day with warm water x 1 week May add herbals: 1 ounce dried comfrey leaf* 1 ounce calendula flowers 1 ounce lavender flowers 1/2 ounce dried uva ursi leaves 1/2 ounce witch hazel blossoms (if you can find them) 1/2 ounce dried sage leaf 1/2 cup sea salt Directions: Bring 2 quarts of water to a boil. Turn off heat, and place 1 ounce (approximately 1 large handful) of the above mixed herbs (not the salt) into the pot. Steep, covered, for 30 minutes.  Strain the liquid well with a fine mesh strainer, and discard the herb material. Add 2 quarts of liquid to the tub, along with the 1/2 cup of salt. This medicinal liquid can also be made into compresses and peri-rinses. 

## 2019-08-16 NOTE — Lactation Note (Signed)
This note was copied from a baby's chart. Lactation Consultation Note  Patient Name: Girl Dwanna Samudio Today's Date: 08/16/2019 Reason for consult: Initial assessment;Primapara;1st time breastfeeding;Term;Infant weight loss  Baby is 24 hours old  Mom reported to the [facility] the feedings during the night and this am have been on and off compared to the 1st 2 latches.  LC offered to assist and changed a large wet.  1st attempted to latch and noted areola edema and can see why the baby has had on/ off feedings .  LC showed mom to hand express and LC instructed mom how to spoon feed 1 ml ./  After spoon feeding worked on latching and obtained depth and baby sustained a latch for 15 mins with increased swallows and per mom comfortable most of the feeding until the last 5 mins. Baby shifted her latch. Nipple well rounded when baby released.  LC recommended due to the areola edema - shells between feedings except when sleeping and reverse pressure. Also pre - pump after breast massage , hand express, pre-pump to make the nipple / areola complex more elastic for a deeper latch.  LC instructed mom how to burp the baby/.  LC reassured and praised mom for her breast feeding efforts.  LC instructed mom on the use hand pump and shells .  Per mom has a DEBP and a HAKKA at home.  LC provided the [facility] pamphlet with phone numbers.    Maternal Data Has patient been taught Hand Expression?: Yes Does the patient have breastfeeding experience prior to this delivery?: No  Feeding Feeding Type: Breast Fed  LATCH Score Latch: Grasps breast easily, tongue down, lips flanged, rhythmical sucking.  Audible Swallowing: A few with stimulation(increased to 2)  Type of Nipple: Everted at rest and after stimulation(areolas more compressible)  Comfort (Breast/Nipple): Soft / non-tender  Hold (Positioning): Assistance needed to correctly position infant at breast and maintain latch.  LATCH Score:  8  Interventions Interventions: Breast feeding basics reviewed;Assisted with latch;Skin to skin;Breast massage;Hand express;Reverse pressure;Breast compression;Adjust position;Support pillows  Lactation Tools Discussed/Used Tools: Shells;Pump Shell Type: Inverted Breast pump type: Manual WIC Program: No Pump Review: Setup, frequency, and cleaning;Milk Storage Initiated by:: MAI Date initiated:: 08/16/19   Consult Status Consult Status: Follow-up Date: 08/17/19 Follow-up type: In-patient    Matilde Sprang Tekeshia Klahr 08/16/2019, 11:09 AM

## 2019-08-17 NOTE — Progress Notes (Signed)
PPD2 SVD:   S:  Pt reports feeling  well/ Tolerating po/ Voiding without problems/ No n/v/ Bleeding is moderate/ Pain controlled withprescription NSAID's including ibuprofen (Motrin)  Newborn info BRF   O:  A & O x 3 / VS: Blood pressure 118/73, pulse 66, temperature 98.4 F (36.9 C), temperature source Oral, resp. rate 18, height 5\' 7"  (1.702 m), weight 76.2 kg, last menstrual period 11/07/2018, SpO2 98 %, unknown if currently breastfeeding.  LABS: No results found for this or any previous visit (from the past 24 hour(s)).  I&O: I/O last 3 completed shifts: In: -  Out: 74 [Blood:74]   No intake/output data recorded.  Lungs: chest clear, no wheezing, rales, normal symmetric air entry  Heart: regular rate and rhythm, S1, S2 normal, no murmur, click, rub or gallop  Abdomen: soft uterus firm @ umb, nontender  Perineum: healing with good reapproximation  Lochia: moderate  Extremities:no redness or tenderness in the calves or thighs, no edema    A/P: PPD # 2/ G1P1001  Doing well  Continue routine post partum orders  D/c instructions reviewed F/u 6 wk

## 2019-08-17 NOTE — Lactation Note (Addendum)
This note was copied from a baby's chart. Lactation Consultation Note  Patient Name: Veronica Figueroa Date: 08/17/2019 Reason for consult: Follow-up assessment  Infant is 52 hrs old. Infant is sleeping contentedly on Mom's lap with bili blanket. Parents report cluster-feeding overnight. Mom's breasts are filling & I anticipate that her milk will come to volume later today. Mom agrees that her breasts feel "much" heavier.    Infant's bili levels did not increase overnight. Mom reports a total of 6 stools since birth. Mom reports that the last stool was not as sticky & more brown than black. Anticipatory education given re: stool changes over the next couple of days.   Mom only has initial discomfort with latching, which quickly resolves.  Mom inquired about her L nipple, which has a minute-sized scab?, but nipples are otherwise atraumatic or demonstrate normal expected tissue changes for beginning to breastfeed. Mom denies nipple shape distortion when infant releases latch. Mom has no concerns and no other questions for me at this time. Mom is wearing shells between feedings. Parents know I'm available for assist if they'd like for me to return.  Mom has a Spectra S2 pump at home.   Lurline Hare [facility] 08/17/2019, 9:10 AM

## 2019-08-17 NOTE — Discharge Summary (Signed)
Obstetric Discharge Summary Reason for Admission: onset of labor Prenatal Procedures: ultrasound Intrapartum Procedures: spontaneous vaginal delivery and episiotomy median Postpartum Procedures: none Complications-Operative and Postpartum:  2nd degree perineal laceration Hemoglobin  Date Value Ref Range Status  08/16/2019 12.4 12.0 - 15.0 g/dL Final   HCT  Date Value Ref Range Status  08/16/2019 36.1 36.0 - 46.0 % Final    Physical Exam:  General: alert, cooperative, and no distress Lochia: appropriate Uterine Fundus: firm Incision: n/a DVT Evaluation: No evidence of DVT seen on physical exam.  Discharge Diagnoses: Term Pregnancy-delivered  Discharge Information: Date: 08/17/2019 Activity: pelvic rest Diet: routine Medications: PNV and Ibuprofen Condition: stable Instructions: refer to practice specific booklet Discharge to: home Follow-up Information     Karena Addison, CNM Follow up in 6 week(s).   Specialty: Obstetrics and Gynecology Contact information: [address] [place] [address] [phone]             Newborn Data: Live born female  Birth Weight: 7 lb 7.9 oz (3399 g) APGAR: 9, 9  Newborn Delivery   Birth date/time: 08/15/2020 17:58:00 Delivery type: Vaginal, Spontaneous      Home with mother.  Xayla Puzio A Margaruite Top 08/17/2019, 12:27 PM

## 2019-08-17 NOTE — Lactation Note (Signed)
This note was copied from a baby's chart. Lactation Consultation Note  Patient Name: Veronica Figueroa Date: 08/17/2019  Veronica Figueroa had questions related to breast softening with feeding. I palpated Veronica Figueroa's R breast (the breast she was feeding from) and it was distinctly softer than it had been when I palpated it earlier this morning.   Veronica Figueroa's nipple was very mildly creased when infant released latch, but infant had spent the last few minutes non-nutritively suckling herself to sleep.   Parents' questions answered; parents know how to reach [place] for any post-discharge questions.   Lurline Hare [facility] 08/17/2019, 1:11 PM

## 2019-08-31 ENCOUNTER — Ambulatory Visit: Payer: Self-pay

## 2019-08-31 NOTE — Lactation Note (Signed)
This note was copied from a baby's chart. Lactation Consultation Note  Patient Name: Veronica Figueroa SHFWY'O Date: 08/31/2019     08/31/2019  Name: Veronica Figueroa MRN: [id number] Date of Birth: 08/15/2020 Gestational Age: Gestational Age: [redacted]w[redacted]d Birth Weight: 119.9 oz Weight today:  Weight: 8 lb 1.5 oz (7772 g)  64 week old term infant presents today with mom for feeding assessment. Mom reports she had a lot of pain the first week and ended up having to stop BF on the right breast for a few days.   Infant has gained 500 grams in the last 14 days with an average daily weight gain of 36 grams a day.   Infant with thick and wide labial frenulum. Upper lip with some tightness with flanging. Upper lip needs flanging on the breast. Mom has pain with initial latch that improves with feeding. Infant with sucking blister to center upper lip.  Infant with short tight lingual frenulum with very little elevation to the center of the tongue. Infant with good tongue extension and lateralization. Infant is sleepy at the breast for most feedings, mom is stimulating infant as needed. Mom given information on tongue and lip restrictions and how they effect milk supply and milk transfer. Mom given website list and local provider information. Mom reports dad and come nieces and nephews has a history of speech issues and tongue and lip ties. Mom feels like they will have infant evaluated.   Mom has golf ball sided reddened area just above the areola at 12:00 on the left breast. The area is firm to touch and not draining per mom. It started 2 days ago. Mom has tried warm showers and massage to relieve. Reviewed how to work out plugged ducts and advised mom to call ob if not resolving in the next few day or she develops a fever or flu like symptoms. Mom fed infant with nose towards lump and pumped post feeding and area did soften. Enc mom to continue working on getting milk flowing.    Infant wakes up for some  feedings, mom awakens infant every 2.5 hours during the day to encourage daytime feeding. Infant is typically taking one breast per feeding.   Infant to follow up with Dr. Maisie Fus tomorrow. Infant to follow up with Lactation as needed or 1-5 days post tongue and lip releases if completed.     General Information: Mother's reason for visit: Feeding Assessment Consult: Initial Lactation consultant: Veronica Mattes RN,IBCLC Breastfeeding experience: Latching has improved, pain with feeding   Maternal medications: Pre-natal vitamin, Motrin (ibuprofen), Stool softener(Fish Oil , Vit D, Probiotic, Ca)  Breastfeeding History: Frequency of breast feeding: every 2-3 hours Duration of feeding: 20-30 minutes  Supplementation: Supplement method: bottle(Mam)         Breast milk volume: not taking a bottle recently     Pump type: Spectra      Infant Output Assessment: Voids per 24 hours: 8+   Stools per 24 hours: 4-6 Stool color: Yellow  Breast Assessment: Breast: Filling, Compressible, Other(see note) Nipple: Erect, Other(see note) Pain level: 3(at beginning) Pain interventions: Bra, All purpose nipple cream, Other(haakaa)  Feeding Assessment: Infant oral assessment: Variance Infant oral assessment comment: see note Positioning: Football(left breast, 15 minutes) Latch: 2 - Grasps breast easily, tongue down, lips flanged, rhythmical sucking. Audible swallowing: 2 - Spontaneous and intermittent Type of nipple: 2 - Everted at rest and after stimulation Comfort: 1 - Filling, red/small blisters or bruises, mild/mod discomfort Hold: 2 -  No assistance needed to correctly position infant at breast LATCH score: 9 Latch assessment: Deep Lips flanged: No(upper lip needs flanging after latch) Suck assessment: Displays both   Pre-feed weight: 3670 grams Post feed weight: 3690 grams Amount transferred: 20 ml Amount supplemented: 0  Additional Feeding Assessment: Infant oral  assessment: Variance Infant oral assessment comment: see note Positioning: Football(right breast, 10 minutes) Latch: 2 - Grasps breast easily, tongue down, lips flanged, rhythmical sucking. Audible swallowing: 2 - Spontaneous and intermittent Type of nipple: 2 - Everted at rest and after stimulation Comfort: 1 - Filling, red/small blisters or bruises, mild/mod discomfort Hold: 2 - No assistance needed to correctly position infant at breast LATCH score: 9 Latch assessment: Deep Lips flanged: Yes Suck assessment: Displays both   Pre-feed weight: 3690 grams Post feed weight: 3738 grams Amount transferred: 48 ml Amount supplemented: 0  Totals: Total amount transferred: 68 ml Total supplement given: 0 Total amount pumped post feed: 15 ml   Plan:  1. Offer infant the breast with feeding cues as you have been 2. Flange lips as needed after latch 3. Offer both breasts with each feeding 4. Empty the first breast before offering the second breast 5. Feed infant skin to skin 6. Stimulate infant to keep her awake with feeding 7. Offer infant a bottle if she is still cueing to feed after breast feeding 8. When using a bottle feed feed using the paced bottle feeding (video on [email or web address].[email or web address]) 9. Infant needs about 68-90 ml (2.5-3 ounces) for 8 feedings a day or 540-720 ml (18-24 ounces) in 24 hours. Infant may eat more or less with each feeding depending on how often they feed. Feed infant until she is satisfied.  10. Apply warm moist compresses to the left breast for 10-20 minutes prior to feeding or pumping 11. Point infant nose or chin towards the plugged area 12. Massage plugged area with feeding or pumping 13. Pump the left breast every 2 hours in place of feeding or after feeding to work out plug 14. Hand express left breast after pumping 15. A Vibrator or electric tooth brush over the area before pumping or feeding 16. Call OB if you develop a fever or flu like symptoms, please call  your OB to ask for antibiotics for Mastitis. If the plug is not resolved in  17. Sunflower Lecithin 1200 mg capsule 4 times a day has been helpful for mom's with plugged ducts. Once plugs are resolved you can decrease to 1 capsule twice a day.  18. Ibuprofen can also help with the inflammation of the breast 19. Keep up the good work 20. Thank you for allowing me to assist you today 21. Please call with questions or concerns as needed [phone] 22. Follow up with lactation as needed or 1-5 days post tongue and lip releases if completed.       Veronica Flood Arnel Wymer RN, IBCLC                                                     Veronica Figueroa 08/31/2019, 10:23 AM

## 2019-09-08 DIAGNOSIS — N61 Mastitis without abscess: Secondary | ICD-10-CM | POA: Diagnosis not present

## 2019-09-12 ENCOUNTER — Other Ambulatory Visit: Payer: Self-pay | Admitting: Obstetrics and Gynecology

## 2019-09-12 DIAGNOSIS — N61 Mastitis without abscess: Secondary | ICD-10-CM

## 2019-09-19 ENCOUNTER — Other Ambulatory Visit: Payer: Self-pay | Admitting: Obstetrics and Gynecology

## 2019-09-19 ENCOUNTER — Ambulatory Visit: Payer: Self-pay

## 2019-09-19 NOTE — Lactation Note (Signed)
This note was copied from a baby's chart. Lactation Consultation Note  Patient Name: Syla Devoss VELFY'B Date: 09/19/2019     09/19/2019  Name: Saraann Enneking MRN: [id number] Date of Birth: 08/15/2020 Gestational Age: Gestational Age: [redacted]w[redacted]d Birth Weight: 119.9 oz Weight today:  Weight: 9 lb 13.4 oz (3352 g)   72 week old infant presents today with mom for follow up feeding assessment. Mom reports infant post tongue and lip releases on 2/11.   Infant has gained 792 grams in the last 19 days with an average daily weight gain of 42 grams a day.   Mom reports her reddened area turned into mastitis. She reports the lump is still there and is painful. She took ATB for 10 days after she left the office. She did see her OB a week after seeing LC. Swelling is down, hard lump remains. She has a cracked nipple on the same breast that is painful for her. Mom has been taking sunflower Lecithin and has decreased to 2 x a day. Mom pumped for a week before latching infant to the left breast again.   Mom with reddened area to breast just above the nipple on the areola at the 12 O'clock area. Area is divoted in and is pink with some bruising. Mom with 1 mm area to outside of left nipple that is dark in color and divoted in. Mom with reddened area to center of the left nipple with honey colored area in the center of the nipple. Mom has been using the [facility] for about 3 weeks. Advised mom to stop using the [facility] since she has been using for 3 weeks. Mom has been able to latch infant to the left breast in the football hold and infant empties the breast better than the pump.  Infant with granulation tissue to upper lip area. Upper lip with some tightness and some slight swelling noted. Infant with diamond shaped incision under tongue. Tongue with better elevation. Infant with tongue thrusting on gloved finger and will not suckle on a pacifier.  Mom reports infant is moving tongue better. Mom reports increased  salivation and spitting since releases. Discussed this is common in most babies after this procedure.  Mom is performing stretches per Dr. Verdene Lennert.   Infant did not latch and feed well on the left breast today. Mom is having pain on the left breast. Mom reports infant has been feeding well on the left breast in the last few days. Infant fed well on the right breast. Infant is being held upright for about 10 minutes after feeding and mom notes less spitting.    Showed mom how to perform suck training. Mom is performing stretches per Dr. Verdene Lennert.   Infant to follow up in 2 weeks via phone. Infant to follow up with Dr. Maisie Fus on Friday 2/19. Infant to follow up with Lactation as needed.      General Information: Mother's reason for visit: Follow up feeding assessment, post tongue and lip releases on 2/1 by Dr. Verdene Lennert Consult: Follow-up Lactation consultant: Nonah Mattes RN,IBCLC Breastfeeding experience: latching better, less time to feed   Maternal medications: Pre-natal vitamin, Motrin (ibuprofen), Other(Fish Oil, Vitamin D, Ca, Sunflower Lecithin 1200 mg BID, Probiotic)  Breastfeeding History: Frequency of breast feeding: every 2-3 hours Duration of feeding: 15-20 minutes, has decreased her times on the breast  Supplementation: Supplement method: bottle(Mam, paced bottle feeding)               Pump type: Spectra  Pump frequency: 1 x at night, usually the left breast Pump volume: 2-3 ounces  Infant Output Assessment: Voids per 24 hours: 8+ Urine color: Clear yellow Stools per 24 hours: 5-6 Stool color: Yellow  Breast Assessment: Breast: Soft, Compressible, Other(See note) Nipple: Erect, Cracked, Reddened, Scabs(See note for left breast) Pain level: 6(with feeding and pumping) Pain interventions: Bra, All purpose nipple cream, Breast pump  Feeding Assessment: Infant oral assessment: Variance Infant oral assessment comment: see note Positioning: Football(left  breast, 5 minutes) Latch: 1 - Repeated attempts needed to sustain latch, nipple held in mouth throughout feeding, stimulation needed to elicit sucking reflex. Audible swallowing: 1 - A few with stimulation Type of nipple: 2 - Everted at rest and after stimulation Comfort: 0 - Engorged, cracked, bleeding, large blisters, severe discomfort Hold: 2 - No assistance needed to correctly position infant at breast LATCH score: 6 Latch assessment: Deep Lips flanged: Yes Suck assessment: Displays both   Pre-feed weight: 4462 grams Post feed weight: 4474 grams Amount transferred: 12 ml Amount supplemented: 0  Additional Feeding Assessment:   Infant oral assessment comment: see note Positioning: Football(right breast, 15 minutes) Latch: 2 - Grasps breast easily, tongue down, lips flanged, rhythmical sucking. Audible swallowing: 2 - Spontaneous and intermittent Type of nipple: 2 - Everted at rest and after stimulation Comfort: 2 - Soft/non-tender Hold: 2 - No assistance needed to correctly position infant at breast LATCH score: 10 Latch assessment: Deep Lips flanged: Yes Suck assessment: Displays both   Pre-feed weight: 4474 grams Post feed weight: 4536 grams Amount transferred: 62 ml Amount supplemented: 0  Totals: Total amount transferred: 74 ml Total supplement given: 0 Total amount pumped post feed: did not pump   Plan:  1. Offer infant the breast with feeding cues as you have been 2. Flange lips as needed after latch 3. Offer both breasts with each feeding 4. Empty the first breast before offering the second breast 5. Feed infant skin to skin 6. Stimulate infant to keep her awake with feeding 7. Offer infant a bottle if she is still cueing to feed after breast feeding 8. When using a bottle feed feed using the paced bottle feeding (video on [email or web address]) 9. Infant needs about 83-110 ml (3-3.5 ounces) for 8 feedings a day or 660-880  ml (22-29 ounces) in 24 hours. Infant may  eat more or less with each feeding depending on how often they feed. Feed infant until she is satisfied.  10. Sunflower Lecithin 1200 mg capsule 4 times a day has been helpful for mom's with plugged ducts. Once plugs are resolved you can decrease to 1 capsule twice a day. 11. Would recommend that you see your OB again to assess the left breast  12. Ibuprofen can also help with the inflammation of the breast 13. Continue stretches per Dr. Orland Mustard 14. Suck training exercises 5-6 x a day for 1-2 minute per exercise for 2-3 weeks or until suck improves 15. Keep up the good work 16. Thank you for allowing me to assist you today 17. Please call with questions or concerns as needed [phone] 18. Follow up with Lactation as needed   Ed Blalock RN, IBCLC                                                   Ed Blalock 09/19/2019, 9:21  AM    

## 2019-09-20 ENCOUNTER — Ambulatory Visit
Admission: RE | Admit: 2019-09-20 | Discharge: 2019-09-20 | Disposition: A | Discharge status: Home / Self Care | Payer: BC Managed Care – PPO | Source: Ambulatory Visit | Attending: Obstetrics and Gynecology | Admitting: Obstetrics and Gynecology

## 2019-09-20 ENCOUNTER — Other Ambulatory Visit: Payer: Self-pay

## 2019-09-20 ENCOUNTER — Other Ambulatory Visit: Payer: Self-pay | Admitting: Obstetrics and Gynecology

## 2019-09-20 DIAGNOSIS — O9112 Abscess of breast associated with the puerperium: Secondary | ICD-10-CM | POA: Diagnosis not present

## 2019-09-20 DIAGNOSIS — N611 Abscess of the breast and nipple: Secondary | ICD-10-CM | POA: Diagnosis not present

## 2019-09-20 DIAGNOSIS — N61 Mastitis without abscess: Secondary | ICD-10-CM

## 2019-09-21 ENCOUNTER — Other Ambulatory Visit: Payer: BC Managed Care – PPO

## 2019-09-26 LAB — AEROBIC/ANAEROBIC CULTURE W GRAM STAIN (SURGICAL/DEEP WOUND)

## 2019-09-28 ENCOUNTER — Ambulatory Visit
Admission: RE | Admit: 2019-09-28 | Discharge: 2019-09-28 | Disposition: A | Discharge status: Home / Self Care | Payer: BC Managed Care – PPO | Source: Ambulatory Visit | Attending: Obstetrics and Gynecology | Admitting: Obstetrics and Gynecology

## 2019-09-28 ENCOUNTER — Other Ambulatory Visit: Payer: Self-pay

## 2019-09-28 DIAGNOSIS — N611 Abscess of the breast and nipple: Secondary | ICD-10-CM | POA: Diagnosis not present

## 2019-09-28 DIAGNOSIS — N61 Mastitis without abscess: Secondary | ICD-10-CM

## 2019-10-17 DIAGNOSIS — M6208 Separation of muscle (nontraumatic), other site: Secondary | ICD-10-CM | POA: Diagnosis not present

## 2019-10-17 DIAGNOSIS — M545 Low back pain: Secondary | ICD-10-CM | POA: Diagnosis not present

## 2019-10-17 DIAGNOSIS — M62830 Muscle spasm of back: Secondary | ICD-10-CM | POA: Diagnosis not present

## 2019-10-17 DIAGNOSIS — M6281 Muscle weakness (generalized): Secondary | ICD-10-CM | POA: Diagnosis not present

## 2019-10-23 DIAGNOSIS — M545 Low back pain: Secondary | ICD-10-CM | POA: Diagnosis not present

## 2019-10-23 DIAGNOSIS — M6281 Muscle weakness (generalized): Secondary | ICD-10-CM | POA: Diagnosis not present

## 2019-10-23 DIAGNOSIS — M6208 Separation of muscle (nontraumatic), other site: Secondary | ICD-10-CM | POA: Diagnosis not present

## 2019-10-23 DIAGNOSIS — M62830 Muscle spasm of back: Secondary | ICD-10-CM | POA: Diagnosis not present

## 2019-10-30 DIAGNOSIS — M545 Low back pain: Secondary | ICD-10-CM | POA: Diagnosis not present

## 2019-10-30 DIAGNOSIS — M6281 Muscle weakness (generalized): Secondary | ICD-10-CM | POA: Diagnosis not present

## 2019-10-30 DIAGNOSIS — M6208 Separation of muscle (nontraumatic), other site: Secondary | ICD-10-CM | POA: Diagnosis not present

## 2019-10-30 DIAGNOSIS — M62830 Muscle spasm of back: Secondary | ICD-10-CM | POA: Diagnosis not present

## 2019-11-07 DIAGNOSIS — M545 Low back pain: Secondary | ICD-10-CM | POA: Diagnosis not present

## 2019-11-07 DIAGNOSIS — M6281 Muscle weakness (generalized): Secondary | ICD-10-CM | POA: Diagnosis not present

## 2019-11-07 DIAGNOSIS — M62830 Muscle spasm of back: Secondary | ICD-10-CM | POA: Diagnosis not present

## 2019-11-07 DIAGNOSIS — M6208 Separation of muscle (nontraumatic), other site: Secondary | ICD-10-CM | POA: Diagnosis not present

## 2019-11-21 DIAGNOSIS — M62830 Muscle spasm of back: Secondary | ICD-10-CM | POA: Diagnosis not present

## 2019-11-21 DIAGNOSIS — M6281 Muscle weakness (generalized): Secondary | ICD-10-CM | POA: Diagnosis not present

## 2019-11-21 DIAGNOSIS — M545 Low back pain: Secondary | ICD-10-CM | POA: Diagnosis not present

## 2019-11-21 DIAGNOSIS — M6208 Separation of muscle (nontraumatic), other site: Secondary | ICD-10-CM | POA: Diagnosis not present

## 2019-11-29 DIAGNOSIS — M6281 Muscle weakness (generalized): Secondary | ICD-10-CM | POA: Diagnosis not present

## 2019-11-29 DIAGNOSIS — M62830 Muscle spasm of back: Secondary | ICD-10-CM | POA: Diagnosis not present

## 2019-11-29 DIAGNOSIS — M6208 Separation of muscle (nontraumatic), other site: Secondary | ICD-10-CM | POA: Diagnosis not present

## 2019-11-29 DIAGNOSIS — M545 Low back pain: Secondary | ICD-10-CM | POA: Diagnosis not present

## 2019-12-05 DIAGNOSIS — M6281 Muscle weakness (generalized): Secondary | ICD-10-CM | POA: Diagnosis not present

## 2019-12-05 DIAGNOSIS — M545 Low back pain: Secondary | ICD-10-CM | POA: Diagnosis not present

## 2019-12-05 DIAGNOSIS — M62830 Muscle spasm of back: Secondary | ICD-10-CM | POA: Diagnosis not present

## 2019-12-05 DIAGNOSIS — M6208 Separation of muscle (nontraumatic), other site: Secondary | ICD-10-CM | POA: Diagnosis not present

## 2019-12-21 DIAGNOSIS — M62830 Muscle spasm of back: Secondary | ICD-10-CM | POA: Diagnosis not present

## 2019-12-21 DIAGNOSIS — M6281 Muscle weakness (generalized): Secondary | ICD-10-CM | POA: Diagnosis not present

## 2019-12-21 DIAGNOSIS — M6208 Separation of muscle (nontraumatic), other site: Secondary | ICD-10-CM | POA: Diagnosis not present

## 2019-12-21 DIAGNOSIS — M545 Low back pain: Secondary | ICD-10-CM | POA: Diagnosis not present

## 2020-01-11 DIAGNOSIS — M6208 Separation of muscle (nontraumatic), other site: Secondary | ICD-10-CM | POA: Diagnosis not present

## 2020-01-11 DIAGNOSIS — M545 Low back pain: Secondary | ICD-10-CM | POA: Diagnosis not present

## 2020-01-11 DIAGNOSIS — M62830 Muscle spasm of back: Secondary | ICD-10-CM | POA: Diagnosis not present

## 2020-01-11 DIAGNOSIS — M6281 Muscle weakness (generalized): Secondary | ICD-10-CM | POA: Diagnosis not present

## 2020-01-25 ENCOUNTER — Ambulatory Visit (INDEPENDENT_AMBULATORY_CARE_PROVIDER_SITE_OTHER): Payer: BC Managed Care – PPO | Admitting: Nurse Practitioner

## 2020-01-25 ENCOUNTER — Encounter: Payer: Self-pay | Admitting: Nurse Practitioner

## 2020-01-25 VITALS — BP 108/71 | HR 73 | Temp 97.9°F | Ht 67.0 in | Wt 133.4 lb

## 2020-01-25 DIAGNOSIS — R439 Unspecified disturbances of smell and taste: Secondary | ICD-10-CM | POA: Diagnosis not present

## 2020-01-25 NOTE — Patient Instructions (Signed)
Try to see if there are any triggers to the alteration in the smell you are experiencing.   We will get labs today and see if these can help explain anything that might be going on. I will let you know once we get the lab tests back and we can discuss our next plan of action.

## 2020-01-25 NOTE — Progress Notes (Signed)
Acute Office Visit  Subjective:    Patient ID: Veronica Figueroa, female    DOB: 02-02-1991, 29 y.o.   MRN: [id number]  Chief Complaint  Patient presents with  . Olfactory Changes    changes with smelling over the last few months, smelling like something is burning or something metallic, when the metallic smell is not present everything smells the same and she cannot differentiate things, saw online that it may be allergy related so she dismissed it but it is not going away    HPI Patient is in today for alteration in her sense of smell over the past several months. She reports intermittent periods burned or metallic odors that are not present. She states that when these odors are not present she does not have any real sense of smell. She states that she is unable to differentiate any odors present and things such as food smell the same to her as a dirty diaper from her baby. She does report seasonal allergies and first attributed her symptom to an exacerbation of her allergies, but all allergy symptoms have subsided and she still has the same olfactory problem. She also reports an alteration in her sense of taste, specifically with sugar and sweet foods. She reports that it does not taste bad, but different than prior to this sense of smell changes.   She had a baby in March of 2020 and is currently breastfeeding. She works from home and is not exposed to any chemicals or noxious fumes. She has no chronic daily medications other than multivitamins and supplements of omega 3 fatty acids, sunflower extract, probiotics, vitamin d, and calcium. She denies any viral or upper respiratory symptoms at the time of the change in her sense of smell. She denies any head trauma or injury. She is a non-smoker. She eats a healthy, well balanced diet, but does have to eliminate certain foods that cause excessive gas in the baby.   She denies increased headaches or migraine headaches, changes to vision, changes to hearing,  rhinorrhea, epistaxis, numbness in the face or head, dry eyes or mucous membranes, weakness, numbness or tingling in her extremities, confusion or mental clouding, or fatigue. She denies a history of diabetes or gestational diabetes. She denies thinning of her hair or hair loss, dry skin, weight gain, constipation, diarrhea, palpitations, nausea, or vomiting.   She endorses increased hunger and thirst, but attributes this to breast feeding. She also endorses an 8 pound decrease in her pre-pregnancy weight, but also attributes this to frequent breast feeding and possibly not taking in enough calories.   She has no known chronic health conditions other than asthma, which she reports is well controlled. She denies any known family history of diabetes. She reports her mother does have a thyroid dysfunction, but she is unsure what kind.   Past Medical History:  Diagnosis Date  . Asthma     Past Surgical History:  Procedure Laterality Date  . TONSILLECTOMY    . WISDOM TOOTH EXTRACTION      Family History  Problem Relation Age of Onset  . Heart attack Mother     Social History   Socioeconomic History  . Marital status: Married    Spouse name: Not on file  . Number of children: Not on file  . Years of education: Not on file  . Highest education level: Not on file  Occupational History  . Not on file  Tobacco Use  . Smoking status: Never Smoker  . Smokeless  tobacco: Never Used  Vaping Use  . Vaping Use: Never used  Substance and Sexual Activity  . Alcohol use: Yes    Alcohol/week: 4.0 standard drinks    Types: 4 Standard drinks or equivalent per week  . Drug use: Never  . Sexual activity: Yes    Birth control/protection: None    Comment: planning on pregnancy 10/2018  Other Topics Concern  . Not on file  Social History Narrative  . Not on file   Social Determinants of Health   Financial Resource Strain:   . Difficulty of Paying Living Expenses:   Food Insecurity:   .  Worried About Charity fundraiser in the Last Year:   . Arboriculturist in the Last Year:   Transportation Needs:   . Film/video editor (Medical):   [name] Lack of Transportation (Non-Medical):   Physical Activity:   . Days of Exercise per Week:   . Minutes of Exercise per Session:   Stress:   . Feeling of Stress :   Social Connections:   . Frequency of Communication with Friends and Family:   . Frequency of Social Gatherings with Friends and Family:   . Attends Religious Services:   . Active Member of Clubs or Organizations:   . Attends Archivist Meetings:   [name] Marital Status:   Intimate Partner Violence:   . Fear of Current or Ex-Partner:   . Emotionally Abused:   [name] Physically Abused:   . Sexually Abused:     Outpatient Medications Prior to Visit  Medication Sig Dispense Refill  . albuterol (PROVENTIL HFA;VENTOLIN HFA) 108 (90 Base) MCG/ACT inhaler Inhale into the lungs every 6 (six) hours as needed.    [name] CALCIUM PO Take 1 tablet by mouth daily.     [name] LINOLEIC ACID-SUNFLOWER OIL PO Take by mouth daily.    . Omega-3 Fatty Acids (FISH OIL PO) Take 1 capsule by mouth daily.     . Prenatal Vit-Fe Fumarate-FA (PRENATAL MULTIVITAMIN) TABS tablet Take 1 tablet by mouth daily at 12 noon.    . Probiotic Product (PROBIOTIC PO) Take 1 capsule by mouth daily.     [name] VITAMIN D PO Take 1 capsule by mouth daily.     . NORLYDA 0.35 MG tablet Take 1 tablet by mouth daily. (Patient not taking: Reported on 01/25/2020)     No facility-administered medications prior to visit.    No Known Allergies    Objective:    Physical Exam Vitals and nursing note reviewed.  Constitutional:      General: She is not in acute distress.    Appearance: Normal appearance. She is normal weight. She is not toxic-appearing.  HENT:     Head: Normocephalic.     Right Ear: Hearing and tympanic membrane normal. No middle ear effusion.     Left Ear: Hearing normal. A middle ear effusion is present.      Nose: Mucosal edema present. No septal deviation, nasal tenderness, congestion or rhinorrhea.     Right Nostril: No epistaxis, septal hematoma or occlusion.     Left Nostril: No epistaxis, septal hematoma or occlusion.     Right Turbinates: Swollen.     Left Turbinates: Swollen.     Right Sinus: No maxillary sinus tenderness or frontal sinus tenderness.     Left Sinus: No maxillary sinus tenderness or frontal sinus tenderness.     Mouth/Throat:     Mouth: Mucous membranes are moist.  Pharynx: Oropharynx is clear. No oropharyngeal exudate or posterior oropharyngeal erythema.  Eyes:     General: Lids are normal. Gaze aligned appropriately. No scleral icterus.       Right eye: No discharge.        Left eye: No discharge.     Extraocular Movements: Extraocular movements intact.     Right eye: Normal extraocular motion and no nystagmus.     Left eye: Normal extraocular motion and no nystagmus.     Conjunctiva/sclera: Conjunctivae normal.     Pupils: Pupils are equal, round, and reactive to light.  Cardiovascular:     Rate and Rhythm: Normal rate and regular rhythm.     Pulses: Normal pulses.     Heart sounds: Normal heart sounds.  Pulmonary:     Effort: Pulmonary effort is normal.     Breath sounds: Normal breath sounds.  Abdominal:     General: Abdomen is flat. Bowel sounds are normal. There is no distension.     Palpations: Abdomen is soft.  Musculoskeletal:        General: Normal range of motion.     Cervical back: Normal range of motion. No rigidity or tenderness.     Right lower leg: No edema.     Left lower leg: No edema.  Lymphadenopathy:     Cervical: No cervical adenopathy.  Skin:    General: Skin is warm and dry.     Capillary Refill: Capillary refill takes less than 2 seconds.  Neurological:     General: No focal deficit present.     Mental Status: She is alert and oriented to person, place, and time.     Cranial Nerves: No cranial nerve deficit.     Sensory: No  sensory deficit.     Motor: No weakness.     Coordination: Coordination normal.     Gait: Gait normal.  Psychiatric:        Mood and Affect: Mood normal.        Behavior: Behavior normal.        Thought Content: Thought content normal.        Judgment: Judgment normal.     BP 108/71   Pulse 73   Temp 97.9 F (36.6 C) (Oral)   Ht _0  (1.702 m)   Wt 133 lb 6.4 oz (60.5 kg)   SpO2 99%   Breastfeeding Yes   BMI 20.89 kg/m  Wt Readings from Last 3 Encounters:  01/25/20 133 lb 6.4 oz (60.5 kg)  08/15/19 168 lb (76.2 kg)  10/14/18 141 lb (64 kg)    There are no preventive care reminders to display for this patient.  There are no preventive care reminders to display for this patient.   No results found for: TSH Lab Results  Component Value Date   WBC 18.7 (H) 08/16/2019   HGB 12.4 08/16/2019   HCT 36.1 08/16/2019   MCV 96.8 08/16/2019   PLT 171 08/16/2019   No results found for: NA, K, CHLORIDE, CO2, GLUCOSE, BUN, CREATININE, BILITOT, ALKPHOS, AST, ALT, PROT, ALBUMIN, CALCIUM, ANIONGAP, EGFR, GFR No results found for: CHOL No results found for: HDL No results found for: LDLCALC No results found for: TRIG No results found for: CHOLHDL No results found for: HGBA1C     Assessment & Plan:   1. Dysosmia Altered olfactory perception of unknown etiology for the past two months. The olfactory sensation changes from a burnt/metalic odor to inability to differentiate between objects that typically  have odor to them. This also appears to have altered her sense of taste for certain items, as well. Her symptoms are very limited. It is possible that this is related to an alteration in hormone levels due to pregnancy and breast feeding, but I would have expected this to cause a problem before the one year mark. Given her family history of thyroid dysfunction, we will test TSH today. I will also test for diabetes and kidney function and electrolytes. If labs are unremarkable, I feel  that MRI with and without contrast of the brain is necessary to ensure that there is no evidence of abnormality I discussed the plan with the patient and a joint decision was made to start with labs and escalate investigation if necessary.   PLAN: - Monitory BMP, A1c, TSH, and Vitamin B12 levels today - She is to monitor for any changes that may be associated with the transient odors that she is noticing.  - If labs are unremarkable, consider MRI of the brain with and without contrast.  -  - BASIC METABOLIC PANEL WITH GFR - Hemoglobin A1c - TSH - Vitamin B12  Will plan to follow-up based on lab results.   Orma Render, NP

## 2020-01-26 ENCOUNTER — Other Ambulatory Visit: Payer: Self-pay | Admitting: Nurse Practitioner

## 2020-01-26 DIAGNOSIS — R439 Unspecified disturbances of smell and taste: Secondary | ICD-10-CM

## 2020-01-26 LAB — BASIC METABOLIC PANEL WITH GFR
BUN: 20 mg/dL (ref 7–25)
CO2: 31 mmol/L (ref 20–32)
Calcium: 9.6 mg/dL (ref 8.6–10.2)
Chloride: 103 mmol/L (ref 98–110)
Creat: 0.83 mg/dL (ref 0.50–1.10)
GFR, Est African American: 110 mL/min/{1.73_m2} (ref >=60)
GFR, Est Non African American: 95 mL/min/{1.73_m2} (ref >=60)
Glucose, Bld: 92 mg/dL (ref 65–139)
Potassium: 4.3 mmol/L (ref 3.5–5.3)
Sodium: 139 mmol/L (ref 135–146)

## 2020-01-26 LAB — HEMOGLOBIN A1C
Hgb A1c MFr Bld: 5 % of total Hgb (ref <5.7)
Mean Plasma Glucose: 97 (calc)
eAG (mmol/L): 5.4 (calc)

## 2020-01-26 LAB — VITAMIN B12: Vitamin B-12: 999 pg/mL (ref 200–1100)

## 2020-01-26 NOTE — Progress Notes (Signed)
So far, everything looks fine with your labs. I am still waiting on the thyroid panel to come back and will let you know when I get that. If that is normal, I would like to schedule an MRI of the head to make sure that we don't see any abnormalities. There could be polyps deep inside that I am unable to see or any number of other things. Are you ok with this plan?

## 2020-01-29 ENCOUNTER — Other Ambulatory Visit: Payer: Self-pay | Admitting: Nurse Practitioner

## 2020-01-29 DIAGNOSIS — R439 Unspecified disturbances of smell and taste: Secondary | ICD-10-CM

## 2020-01-31 ENCOUNTER — Other Ambulatory Visit: Payer: Self-pay | Admitting: Nurse Practitioner

## 2020-01-31 DIAGNOSIS — R439 Unspecified disturbances of smell and taste: Secondary | ICD-10-CM

## 2020-01-31 NOTE — Progress Notes (Signed)
Orders placed for TSH and COVID-19 antibody testing for [facility]

## 2020-02-01 ENCOUNTER — Other Ambulatory Visit: Payer: Self-pay | Admitting: Nurse Practitioner

## 2020-02-01 DIAGNOSIS — R7989 Other specified abnormal findings of blood chemistry: Secondary | ICD-10-CM

## 2020-02-01 DIAGNOSIS — E0590 Thyrotoxicosis, unspecified without thyrotoxic crisis or storm: Secondary | ICD-10-CM | POA: Diagnosis not present

## 2020-02-01 DIAGNOSIS — R439 Unspecified disturbances of smell and taste: Secondary | ICD-10-CM | POA: Diagnosis not present

## 2020-02-02 LAB — TSH: TSH: 0.011 u[IU]/mL — ABNORMAL LOW (ref 0.450–4.500)

## 2020-02-02 LAB — SAR COV2 SEROLOGY (COVID19)AB(IGG),IA: DiaSorin SARS-CoV-2 Ab, IgG: NEGATIVE

## 2020-02-07 ENCOUNTER — Other Ambulatory Visit: Payer: Self-pay

## 2020-02-07 DIAGNOSIS — E0590 Thyrotoxicosis, unspecified without thyrotoxic crisis or storm: Secondary | ICD-10-CM

## 2020-02-07 NOTE — Progress Notes (Addendum)
Veronica Figueroa,   We may have some answers for how you are feeling. Your recent test results show that you have a problem with your thyroid. Right now, it appears that you have Hyperthyroidism.  This means that your thyroid gland is overactive. It can cause weakness, anxiety, weight loss, frequent bowel movements, brittle hair, and many other changes. It can also cause an alteration in your sense of smell. This could also be caused by a certain type of Hypothyroidism. The best way to determine the condition and the extent is to add a test called T3 and T4 to the TSH test. We will call the lab and see if we can add this. Meanwhile, I am going to send a referral to endocrinology for discussion on management of the condition. If you have any questions- I am available to help. I also recommend looking online at [email or web address].   Your COVID antibodies were negative, which means that you most likely have not had COVID and you are currently not protected against the virus. Please be sure to keep yourself safe and wear your mask in public.   Baxter Hire, can we call [facility] to add the t3 and t4. I have the orders teed up.

## 2020-02-07 NOTE — Addendum Note (Signed)
Addended by: Mc Hollen, Huntley Dec E on: 02/07/2020 09:28 AM   Modules accepted: Orders

## 2020-02-08 LAB — T4, FREE: Free T4: 2.76 ng/dL — ABNORMAL HIGH (ref 0.82–1.77)

## 2020-02-08 LAB — SPECIMEN STATUS REPORT

## 2020-02-08 LAB — T3, FREE: T3, Free: 6.1 pg/mL — ABNORMAL HIGH (ref 2.0–4.4)

## 2020-02-13 ENCOUNTER — Telehealth: Payer: Self-pay

## 2020-02-13 NOTE — Telephone Encounter (Signed)
Patient has some concerns about the hypothyroidism Dx. Patient says she still is having some loss of sense of taste and smell and would like to find out the cause. Please advise

## 2020-02-13 NOTE — Telephone Encounter (Signed)
Please contact patient. If she has concerns and would like further workup, the best option would be to have her schedule a follow up appointment to discuss this. Veronica Figueroa is out of the office this week but will return next week if she would specifically like to see her.

## 2020-02-13 NOTE — Telephone Encounter (Signed)
Patient contacted and is now asking for a MRI.

## 2020-02-14 NOTE — Telephone Encounter (Signed)
Patient contacted made aware another appointment should scheduled for evaluation before MRI referral can be put in. Patient call was transferred to front desk staff for scheduling.

## 2020-02-14 NOTE — Telephone Encounter (Signed)
As I have never seen the patient, I do not feel comfortable ordering an MRI especially in the setting of newly diagnosed hyperthyroidism and patient concerns about the diagnosis. That said, if she would like to come in and talk about her concerns and further workup, I'll be glad to see her. Her other option is to wait for SaraBeth to come back on Monday as she is familiar with the situation.

## 2020-02-14 NOTE — Addendum Note (Signed)
Addended by: Camora Tremain, Huntley Dec E on: 02/14/2020 08:41 PM   Modules accepted: Orders

## 2020-02-19 ENCOUNTER — Other Ambulatory Visit: Payer: Self-pay | Admitting: Nurse Practitioner

## 2020-02-19 ENCOUNTER — Ambulatory Visit: Payer: BC Managed Care – PPO | Admitting: Nurse Practitioner

## 2020-02-19 ENCOUNTER — Telehealth: Payer: Self-pay | Admitting: Nurse Practitioner

## 2020-02-19 DIAGNOSIS — R430 Anosmia: Secondary | ICD-10-CM

## 2020-02-19 DIAGNOSIS — E0590 Thyrotoxicosis, unspecified without thyrotoxic crisis or storm: Secondary | ICD-10-CM

## 2020-02-19 NOTE — Telephone Encounter (Signed)
Patient advised of recommendations. Cancelled appointment.

## 2020-02-19 NOTE — Progress Notes (Unsigned)
I have ordered the MRI of the brain with and without contrast for further evaluation of loss of smell. I tried to call the patient this morning but no answer/no voicemail set up. Please call the patient and let her know that she does not need to come in for the appointment today, unless she would like to discuss this further. We can keep her on the schedule until we hear something from her. Please let her know MRI will be contacting her regarding the scheduling of the test once we have approval from insurance. We can schedule a follow-up appointment after the MRI to discuss the findings.

## 2020-02-19 NOTE — Telephone Encounter (Signed)
I sent Baxter Hire a message this morning, but she is out for the day. I tried to call Abree and let her know that I placed the order for her MRI due to her loss of smell, but she did not answer and no voicemail set up. If you don't mind, can you try to call her this morning to let her know that she does not necessarily need to come in this afternoon and we can follow-up once we get the MRI results. Also, please let her know that once insurance has approved, the MRI scheduling office will be in contact with her. If she wants to come in anyway, that is fine, as well. I just wanted to save her a visit if she didn't need to see me. Thanks!

## 2020-02-21 NOTE — Progress Notes (Unsigned)
Pt aware of recommendations and is agreeable to the plan. No further questions or concerns at this time.

## 2020-02-27 ENCOUNTER — Ambulatory Visit (INDEPENDENT_AMBULATORY_CARE_PROVIDER_SITE_OTHER): Payer: BC Managed Care – PPO

## 2020-02-27 ENCOUNTER — Other Ambulatory Visit: Payer: Self-pay

## 2020-02-27 DIAGNOSIS — R430 Anosmia: Secondary | ICD-10-CM

## 2020-02-27 DIAGNOSIS — J3489 Other specified disorders of nose and nasal sinuses: Secondary | ICD-10-CM | POA: Diagnosis not present

## 2020-02-27 DIAGNOSIS — J0120 Acute ethmoidal sinusitis, unspecified: Secondary | ICD-10-CM | POA: Diagnosis not present

## 2020-02-27 DIAGNOSIS — J322 Chronic ethmoidal sinusitis: Secondary | ICD-10-CM | POA: Diagnosis not present

## 2020-02-27 DIAGNOSIS — J0100 Acute maxillary sinusitis, unspecified: Secondary | ICD-10-CM | POA: Diagnosis not present

## 2020-02-27 MED ORDER — GADOBUTROL 1 MMOL/ML IV SOLN
6.0000 mL | Freq: Once | INTRAVENOUS | Status: AC | PRN
Start: 2020-02-27 — End: 2020-02-27
  Administered 2020-02-27: 7.5 mL via INTRAVENOUS

## 2020-02-28 ENCOUNTER — Encounter: Payer: Self-pay | Admitting: Nurse Practitioner

## 2020-02-28 DIAGNOSIS — E0590 Thyrotoxicosis, unspecified without thyrotoxic crisis or storm: Secondary | ICD-10-CM | POA: Insufficient documentation

## 2020-02-28 NOTE — Progress Notes (Signed)
MRI results do not show an identifiable cause of the loss of smell you are experiencing. There is a small amount of swelling in your sinus cavity, but that should not lead to the level of symptoms you are experiencing.   The only abnormality noted is a single, small area of "hyperintensity". This is a fairly common finding on MRI, and is seen more frequently the older you get. These can correlate with migraine headaches. They may also correlate with ischemia, or reduced blood flow, to a single area of the brain. We often see this in older patients or patients who have high risks for increased plaque in the arteries.   I will discuss this finding with a neurologist to determine if this is something that needs further evaluation or if this is an incidental finding on the MRI that is not of concern. I will be in touch to let you know the plan.

## 2020-02-29 ENCOUNTER — Telehealth: Payer: Self-pay | Admitting: Nurse Practitioner

## 2020-02-29 ENCOUNTER — Other Ambulatory Visit: Payer: Self-pay | Admitting: Nurse Practitioner

## 2020-02-29 DIAGNOSIS — R9082 White matter disease, unspecified: Secondary | ICD-10-CM

## 2020-02-29 NOTE — Telephone Encounter (Signed)
Please call the patient and let her know the results of the MRI (see result note).   Please let her know I have spoken with a neurologist and he recommends referral to [facility] in [place] to assess her symptoms and the MRI results. They have numerologists in several specialty areas and therefore would be best to assess the situation.   Please let her know that a single area of hyperintensity is reassuring that this is not a significant finding, but we want to make sure we cover all of the bases in the event this could be something that needs further review.   I have placed the referral and requested she be seen within the next 7 days. Please ask her to notify the office if she has not heard from Neurology by next Friday.

## 2020-02-29 NOTE — Telephone Encounter (Signed)
Patient aware of results and recommendations. Agreeable to referral to Neurology. No further questions or concerns at this time.

## 2020-04-01 DIAGNOSIS — E0590 Thyrotoxicosis, unspecified without thyrotoxic crisis or storm: Secondary | ICD-10-CM | POA: Diagnosis not present

## 2020-05-06 DIAGNOSIS — Z6820 Body mass index (BMI) 20.0-20.9, adult: Secondary | ICD-10-CM | POA: Diagnosis not present

## 2020-05-06 DIAGNOSIS — Z01419 Encounter for gynecological examination (general) (routine) without abnormal findings: Secondary | ICD-10-CM | POA: Diagnosis not present

## 2020-05-13 DIAGNOSIS — E0590 Thyrotoxicosis, unspecified without thyrotoxic crisis or storm: Secondary | ICD-10-CM | POA: Diagnosis not present

## 2020-07-05 DIAGNOSIS — E0590 Thyrotoxicosis, unspecified without thyrotoxic crisis or storm: Secondary | ICD-10-CM | POA: Diagnosis not present

## 2020-07-08 DIAGNOSIS — E069 Thyroiditis, unspecified: Secondary | ICD-10-CM | POA: Diagnosis not present

## 2020-08-03 NOTE — L&D Delivery Note (Signed)
Delivery Note:   G2P1001 at [redacted]w[redacted]d  Admitting diagnosis: Normal labor [O80, Z37.9] Risks: Moderate meconium, GBS positive Onset of labor: 04/05/2021 at 1600 IOL/Augmentation: AROM ROM: 04/05/2021 at 2257, moderate meconium  Complete dilation at 04/06/2021  0013 Onset of pushing at 0015 FHR second stage reassuring  Analgesia /Anesthesia intrapartum:Epidural  Pushing in lithotomy position with CNM and L&D staff support. Husband, [name], present for birth and supportive.  Delivery of a Live born female  Birth Weight:  3790g, 8lb 5.7oz APGAR: 9, 9  Newborn Delivery   Birth date/time: 04/06/2021 00:20:00 Delivery type: Vaginal, Spontaneous     in cephalic presentation, position OA to LOA.  APGAR:1 min-9 , 5 min-9   Nuchal Cord: No  Cord double clamped after cessation of pulsation, cut by [name].  Collection of cord blood for typing completed. Cord blood donation-None  Arterial cord blood sample-No    Placenta delivered-Spontaneous  with 3 vessels . Uterotonics: Pitocin Placenta to L&D Uterine tone firm  Bleeding small  1st degree;Vaginal  laceration identified.  Episiotomy:None  Local analgesia: N/A  Repair: 3-0 in usual fashion with excellent hemostasis. Est. Blood Loss (mL):150.00   Complications: None  Mom to postpartum. Baby Kellan to Couplet care / Skin to Skin.  Delivery Report:   Review the Delivery Report for details.    June Leap, CNM, MSN 04/06/2021, 12:47 AM

## 2020-08-06 DIAGNOSIS — Z3200 Encounter for pregnancy test, result unknown: Secondary | ICD-10-CM | POA: Diagnosis not present

## 2020-08-06 DIAGNOSIS — Z3689 Encounter for other specified antenatal screening: Secondary | ICD-10-CM | POA: Diagnosis not present

## 2020-08-06 DIAGNOSIS — Z3491 Encounter for supervision of normal pregnancy, unspecified, first trimester: Secondary | ICD-10-CM | POA: Diagnosis not present

## 2020-08-09 ENCOUNTER — Other Ambulatory Visit: Payer: Self-pay

## 2020-08-09 MED ORDER — ALBUTEROL SULFATE HFA 108 (90 BASE) MCG/ACT IN AERS: 1.0000 | INHALATION_SPRAY | Freq: Four times a day (QID) | RESPIRATORY_TRACT | 0 refills | Status: DC | PRN

## 2020-08-12 DIAGNOSIS — Z3201 Encounter for pregnancy test, result positive: Secondary | ICD-10-CM | POA: Diagnosis not present

## 2020-09-09 ENCOUNTER — Other Ambulatory Visit: Payer: Self-pay | Admitting: Nurse Practitioner

## 2020-09-10 DIAGNOSIS — Z3A10 10 weeks gestation of pregnancy: Secondary | ICD-10-CM | POA: Diagnosis not present

## 2020-09-10 DIAGNOSIS — Z124 Encounter for screening for malignant neoplasm of cervix: Secondary | ICD-10-CM | POA: Diagnosis not present

## 2020-09-10 DIAGNOSIS — Z8639 Personal history of other endocrine, nutritional and metabolic disease: Secondary | ICD-10-CM | POA: Diagnosis not present

## 2020-09-10 DIAGNOSIS — Z3481 Encounter for supervision of other normal pregnancy, first trimester: Secondary | ICD-10-CM | POA: Diagnosis not present

## 2020-09-10 DIAGNOSIS — O418X10 Other specified disorders of amniotic fluid and membranes, first trimester, not applicable or unspecified: Secondary | ICD-10-CM | POA: Diagnosis not present

## 2020-09-10 DIAGNOSIS — Z3689 Encounter for other specified antenatal screening: Secondary | ICD-10-CM | POA: Diagnosis not present

## 2020-10-07 DIAGNOSIS — Z3A14 14 weeks gestation of pregnancy: Secondary | ICD-10-CM | POA: Diagnosis not present

## 2020-10-07 DIAGNOSIS — Z36 Encounter for antenatal screening for chromosomal anomalies: Secondary | ICD-10-CM | POA: Diagnosis not present

## 2020-10-07 DIAGNOSIS — O418X20 Other specified disorders of amniotic fluid and membranes, second trimester, not applicable or unspecified: Secondary | ICD-10-CM | POA: Diagnosis not present

## 2020-11-05 DIAGNOSIS — F418 Other specified anxiety disorders: Secondary | ICD-10-CM | POA: Diagnosis not present

## 2020-11-05 DIAGNOSIS — Z363 Encounter for antenatal screening for malformations: Secondary | ICD-10-CM | POA: Diagnosis not present

## 2021-01-14 DIAGNOSIS — Z3689 Encounter for other specified antenatal screening: Secondary | ICD-10-CM | POA: Diagnosis not present

## 2021-01-28 DIAGNOSIS — Z8639 Personal history of other endocrine, nutritional and metabolic disease: Secondary | ICD-10-CM | POA: Diagnosis not present

## 2021-01-29 DIAGNOSIS — M5489 Other dorsalgia: Secondary | ICD-10-CM | POA: Diagnosis not present

## 2021-02-21 ENCOUNTER — Telehealth (INDEPENDENT_AMBULATORY_CARE_PROVIDER_SITE_OTHER): Payer: BC Managed Care – PPO | Admitting: Medical-Surgical

## 2021-02-21 ENCOUNTER — Encounter: Payer: Self-pay | Admitting: Medical-Surgical

## 2021-02-21 DIAGNOSIS — J329 Chronic sinusitis, unspecified: Secondary | ICD-10-CM

## 2021-02-21 DIAGNOSIS — J40 Bronchitis, not specified as acute or chronic: Secondary | ICD-10-CM

## 2021-02-21 MED ORDER — AMOXICILLIN 875 MG PO TABS: 875.0000 mg | ORAL_TABLET | Freq: Two times a day (BID) | ORAL | 0 refills | Status: AC

## 2021-02-21 NOTE — Progress Notes (Signed)
Virtual Visit via Video Note  I connected with Ceili Wolaver on 02/21/21 at  1:00 PM EDT by a video enabled telemedicine application and verified that I am speaking with the correct person using two identifiers.   I discussed the limitations of evaluation and management by telemedicine and the availability of in person appointments. The patient expressed understanding and agreed to proceed.  Patient location: home Provider locations: office  Subjective:    CC: Nasal congestion  HPI: Pleasant 30 year old female presenting today via MyChart video visit with reports of 10 days of nasal congestion, chest congestion, cough productive of thick green mucus, and generalized malaise.  She has tested negative for COVID.  She is currently 7-1/2 months pregnant and has not been doing anything over-the-counter to help with symptoms but she was not sure what was safe.  No documented fevers or other symptoms such as chills, shortness of breath, chest pain, and GI symptoms.  Past medical history, Surgical history, Family history not pertinant except as noted below, Social history, Allergies, and medications have been entered into the medical record, reviewed, and corrections made.   Review of Systems: See HPI for pertinent positives and negatives.   Objective:    General: Speaking clearly in complete sentences without any shortness of breath.  Alert and oriented x3.  Normal judgment. No apparent acute distress.  Impression and Recommendations:    1. Sinobronchitis Start amoxicillin 875 mg twice daily x10 days.  Continue increased fluids and rest.  Consider adding nasal saline sprays or rinses to help with congestion.  I discussed the assessment and treatment plan with the patient. The patient was provided an opportunity to ask questions and all were answered. The patient agreed with the plan and demonstrated an understanding of the instructions.   The patient was advised to call back or seek an  in-person evaluation if the symptoms worsen or if the condition fails to improve as anticipated.  20 minutes of non-face-to-face time was provided during this encounter.  Return if symptoms worsen or fail to improve.  Thayer Ohm, DNP, APRN, FNP-BC [facility]

## 2021-02-24 ENCOUNTER — Telehealth: Payer: BC Managed Care – PPO | Admitting: Medical-Surgical

## 2021-03-11 DIAGNOSIS — Z3A36 36 weeks gestation of pregnancy: Secondary | ICD-10-CM | POA: Diagnosis not present

## 2021-03-11 DIAGNOSIS — O99283 Endocrine, nutritional and metabolic diseases complicating pregnancy, third trimester: Secondary | ICD-10-CM | POA: Diagnosis not present

## 2021-03-11 DIAGNOSIS — Z3685 Encounter for antenatal screening for Streptococcus B: Secondary | ICD-10-CM | POA: Diagnosis not present

## 2021-04-05 ENCOUNTER — Encounter (HOSPITAL_COMMUNITY): Payer: Self-pay | Admitting: Obstetrics and Gynecology

## 2021-04-05 ENCOUNTER — Inpatient Hospital Stay (HOSPITAL_COMMUNITY): Payer: BC Managed Care – PPO | Admitting: Anesthesiology

## 2021-04-05 ENCOUNTER — Inpatient Hospital Stay (HOSPITAL_COMMUNITY)
Admission: AD | Admit: 2021-04-05 | Discharge: 2021-04-07 | DRG: 807 | Disposition: A | Discharge status: Home / Self Care | Payer: BC Managed Care – PPO | Attending: Obstetrics and Gynecology | Admitting: Obstetrics and Gynecology

## 2021-04-05 ENCOUNTER — Other Ambulatory Visit: Payer: Self-pay

## 2021-04-05 DIAGNOSIS — B951 Streptococcus, group B, as the cause of diseases classified elsewhere: Secondary | ICD-10-CM | POA: Diagnosis present

## 2021-04-05 DIAGNOSIS — O99824 Streptococcus B carrier state complicating childbirth: Principal | ICD-10-CM | POA: Diagnosis present

## 2021-04-05 DIAGNOSIS — O9952 Diseases of the respiratory system complicating childbirth: Secondary | ICD-10-CM | POA: Diagnosis not present

## 2021-04-05 DIAGNOSIS — E0590 Thyrotoxicosis, unspecified without thyrotoxic crisis or storm: Secondary | ICD-10-CM | POA: Diagnosis present

## 2021-04-05 DIAGNOSIS — Z412 Encounter for routine and ritual male circumcision: Secondary | ICD-10-CM | POA: Diagnosis not present

## 2021-04-05 DIAGNOSIS — Z3A39 39 weeks gestation of pregnancy: Secondary | ICD-10-CM | POA: Diagnosis not present

## 2021-04-05 DIAGNOSIS — Z20822 Contact with and (suspected) exposure to covid-19: Secondary | ICD-10-CM | POA: Diagnosis not present

## 2021-04-05 DIAGNOSIS — O26893 Other specified pregnancy related conditions, third trimester: Secondary | ICD-10-CM | POA: Diagnosis not present

## 2021-04-05 DIAGNOSIS — Z3A40 40 weeks gestation of pregnancy: Secondary | ICD-10-CM | POA: Diagnosis not present

## 2021-04-05 DIAGNOSIS — Z8639 Personal history of other endocrine, nutritional and metabolic disease: Secondary | ICD-10-CM

## 2021-04-05 DIAGNOSIS — J45909 Unspecified asthma, uncomplicated: Secondary | ICD-10-CM | POA: Diagnosis present

## 2021-04-05 LAB — TYPE AND SCREEN
ABO/RH(D): A POS
Antibody Screen: NEGATIVE

## 2021-04-05 LAB — CBC
HCT: 39.5 % (ref 36.0–46.0)
Hemoglobin: 14.1 g/dL (ref 12.0–15.0)
MCH: 33 pg (ref 26.0–34.0)
MCHC: 35.7 g/dL (ref 30.0–36.0)
MCV: 92.5 fL (ref 80.0–100.0)
Platelets: 191 10*3/uL (ref 150–400)
RBC: 4.27 MIL/uL (ref 3.87–5.11)
RDW: 13.2 % (ref 11.5–15.5)
WBC: 12.1 10*3/uL — ABNORMAL HIGH (ref 4.0–10.5)
nRBC: 0 % (ref 0.0–0.2)

## 2021-04-05 LAB — RESP PANEL BY RT-PCR (FLU A&B, COVID) ARPGX2
Influenza A by PCR: NEGATIVE
Influenza B by PCR: NEGATIVE
SARS Coronavirus 2 by RT PCR: NEGATIVE

## 2021-04-05 MED ORDER — LACTATED RINGERS IV SOLN: 500.0000 mL | Freq: Once | INTRAVENOUS | Status: DC

## 2021-04-05 MED ORDER — SODIUM CHLORIDE 0.9% FLUSH: 3.0000 mL | Freq: Two times a day (BID) | INTRAVENOUS | Status: DC

## 2021-04-05 MED ORDER — SOD CITRATE-CITRIC ACID 500-334 MG/5ML PO SOLN: 30.0000 mL | ORAL | Status: DC | PRN

## 2021-04-05 MED ORDER — LACTATED RINGERS IV SOLN
500.0000 mL | INTRAVENOUS | Status: DC | PRN
  Administered 2021-04-05: 1000 mL via INTRAVENOUS

## 2021-04-05 MED ORDER — LIDOCAINE HCL (PF) 1 % IJ SOLN: 30.0000 mL | INTRAMUSCULAR | Status: DC | PRN

## 2021-04-05 MED ORDER — PHENYLEPHRINE 40 MCG/ML (10ML) SYRINGE FOR IV PUSH (FOR BLOOD PRESSURE SUPPORT): 80.0000 ug | PREFILLED_SYRINGE | INTRAVENOUS | Status: DC | PRN

## 2021-04-05 MED ORDER — SODIUM CHLORIDE 0.9 % IV SOLN: 250.0000 mL | INTRAVENOUS | Status: DC | PRN

## 2021-04-05 MED ORDER — FENTANYL-BUPIVACAINE-NACL 0.5-0.125-0.9 MG/250ML-% EP SOLN
12.0000 mL/h | EPIDURAL | Status: DC | PRN
Start: 2021-04-05 — End: 2021-04-06

## 2021-04-05 MED ORDER — OXYTOCIN BOLUS FROM INFUSION: 333.0000 mL | Freq: Once | INTRAVENOUS | Status: DC

## 2021-04-05 MED ORDER — ACETAMINOPHEN 500 MG PO TABS: 1000.0000 mg | ORAL_TABLET | Freq: Four times a day (QID) | ORAL | Status: DC | PRN

## 2021-04-05 MED ORDER — SODIUM CHLORIDE 0.9 % IV SOLN: 1.0000 g | INTRAVENOUS | Status: DC

## 2021-04-05 MED ORDER — OXYTOCIN 10 UNIT/ML IJ SOLN: 10.0000 [IU] | Freq: Once | INTRAMUSCULAR | Status: DC

## 2021-04-05 MED ORDER — ONDANSETRON HCL 4 MG/2ML IJ SOLN: 4.0000 mg | Freq: Four times a day (QID) | INTRAMUSCULAR | Status: DC | PRN

## 2021-04-05 MED ORDER — SODIUM CHLORIDE 0.9 % IV SOLN
2.0000 g | Freq: Once | INTRAVENOUS | Status: AC
  Administered 2021-04-05: 2 g via INTRAVENOUS
  Filled 2021-04-05: qty 2000

## 2021-04-05 MED ORDER — LACTATED RINGERS IV SOLN: INTRAVENOUS | Status: DC

## 2021-04-05 MED ORDER — FENTANYL CITRATE (PF) 100 MCG/2ML IJ SOLN: 50.0000 ug | INTRAMUSCULAR | Status: DC | PRN

## 2021-04-05 MED ORDER — OXYTOCIN-SODIUM CHLORIDE 30-0.9 UT/500ML-% IV SOLN
2.5000 [IU]/h | INTRAVENOUS | Status: DC
  Filled 2021-04-05: qty 500

## 2021-04-05 MED ORDER — LIDOCAINE HCL (PF) 1 % IJ SOLN
INTRAMUSCULAR | Status: DC | PRN
  Administered 2021-04-05: 5 mL via EPIDURAL

## 2021-04-05 MED ORDER — SODIUM CHLORIDE 0.9% FLUSH: 3.0000 mL | INTRAVENOUS | Status: DC | PRN

## 2021-04-05 MED ORDER — DIPHENHYDRAMINE HCL 50 MG/ML IJ SOLN: 12.5000 mg | INTRAMUSCULAR | Status: DC | PRN

## 2021-04-05 MED ORDER — FENTANYL-BUPIVACAINE-NACL 0.5-0.125-0.9 MG/250ML-% EP SOLN
EPIDURAL | Status: AC
  Filled 2021-04-05: qty 250

## 2021-04-05 MED ORDER — EPHEDRINE 5 MG/ML INJ: 10.0000 mg | INTRAVENOUS | Status: DC | PRN

## 2021-04-05 MED ORDER — FENTANYL-BUPIVACAINE-NACL 0.5-0.125-0.9 MG/250ML-% EP SOLN
EPIDURAL | Status: DC | PRN
  Administered 2021-04-05: 12 mL/h via EPIDURAL

## 2021-04-05 NOTE — Progress Notes (Signed)
S: Resting comfortably after epidural. Discussed the R/B/A of AROM for labor augmentation and patient consents to procedure. Husband, [name], present and supportive.   O: Vitals:   04/05/21 2235 04/05/21 2241 04/05/21 2245 04/05/21 2250  BP: 120/74 112/74 113/72 111/74  Pulse: 63 65 76 87  Resp:      Temp:      TempSrc:      SpO2:      Weight:      Height:       FHT:  FHR: 130 bpm, variability: moderate,  accelerations:  Present,  decelerations:  Absent UC:   irregular, every 3-5.5 minutes SVE:   Dilation: 6.5 Effacement (%): 80 Station: -2 Exam by:: Dorisann Frames, CNM  AROM of a moderate amount of moderate meconium stained fluid at 2257.   A / P: Spontaneous labor, progressing normally  Fetal Wellbeing:  Category I I/D: GBS positive, treated adequately with Ampicillin x 1 dose Pain Control:  Epidural Anticipated MOD:  NSVD  Veronica Figueroa, CNM, MSN 04/05/2021, 11:18 PM

## 2021-04-05 NOTE — Anesthesia Preprocedure Evaluation (Signed)
Anesthesia Evaluation  Patient identified by MRN, date of birth, ID band Patient awake    Reviewed: Allergy & Precautions, NPO status , Patient's Chart, lab work & pertinent test results  Airway Mallampati: II  TM Distance: >3 FB Neck ROM: Full    Dental no notable dental hx. (+) Teeth Intact, Dental Advisory Given   Pulmonary asthma ,    Pulmonary exam normal breath sounds clear to auscultation       Cardiovascular Exercise Tolerance: Good negative cardio ROS Normal cardiovascular exam Rhythm:Regular Rate:Normal     Neuro/Psych negative neurological ROS  negative psych ROS   GI/Hepatic negative GI ROS, Neg liver ROS,   Endo/Other  negative endocrine ROS  Renal/GU negative Renal ROS     Musculoskeletal   Abdominal   Peds  Hematology Lab Results      Component                Value               Date                      WBC                      12.1 (H)            04/05/2021                HGB                      14.1                04/05/2021                HCT                      39.5                04/05/2021                MCV                      92.5                04/05/2021                PLT                      191                 04/05/2021              Anesthesia Other Findings   Reproductive/Obstetrics (+) Pregnancy                             Anesthesia Physical Anesthesia Plan  ASA: 2  Anesthesia Plan: Epidural   Post-op Pain Management:    Induction:   PONV Risk Score and Plan:   Airway Management Planned:   Additional Equipment:   Intra-op Plan:   Post-operative Plan:   Informed Consent: I have reviewed the patients History and Physical, chart, labs and discussed the procedure including the risks, benefits and alternatives for the proposed anesthesia with the patient or authorized representative who has indicated his/her understanding and acceptance.        Plan Discussed with:   Anesthesia Plan Comments: (  39.6 Wk G2p1 for LEA)        Anesthesia Quick Evaluation

## 2021-04-05 NOTE — H&P (Signed)
OB ADMISSION/ HISTORY & PHYSICAL:  Admission Date: 04/05/2021  8:40 PM  Admit Diagnosis: Water broke, CTX 5 min    Veronica Figueroa is a 30 y.o. female at [redacted]w[redacted]d presenting for contractions since 1600 that have increased in intensity. Reports leaking of blood tinged fluid since 1900. Endorses + fetal movement. GBS positive. Husband, Christiane Ha, present and supportive. Eagerly anticipating a surprise baby.   Prenatal History: G2P1001   EDC : 04/06/2021 Prenatal care at [facility] since 10 weeks, primary M. Sigmon CNM  Prenatal course complicated by: History of PP hyperthyroidism, TFTs normal this pregnancy GBS positive  Prenatal Labs: ABO, Rh:   A POS Antibody:   Negative Rubella:   Immune RPR:   Non-reactive HBsAg:   Negative HIV:   Negative GBS:   POSITIVE 1 hr Glucola: 76 Genetic Screening: Declined Ultrasound: normal anatomy, anterior placenta, AFI WNL, EFW 83%  TDaP          Declined COVID-19 Declined    Maternal Diabetes: No Genetic Screening: Declined Maternal Ultrasounds/Referrals: Normal Fetal Ultrasounds or other Referrals:  None Maternal Substance Abuse:  No Significant Maternal Medications:  None Significant Maternal Lab Results:  Group B Strep positive Other Comments:  None  Medical / Surgical History : Past medical history:  Past Medical History:  Diagnosis Date   Asthma     Past surgical history:  Past Surgical History:  Procedure Laterality Date   TONSILLECTOMY     WISDOM TOOTH EXTRACTION      Family History:  Family History  Problem Relation Age of Onset   Heart attack Mother     Social History:  reports that she has never smoked. She has never used smokeless tobacco. She reports current alcohol use of about 4.0 standard drinks per week. She reports that she does not use drugs. Allergies: Patient has no known allergies.   Current Medications at time of admission:  Medications Prior to Admission  Medication Sig Dispense Refill Last Dose    CALCIUM PO Take 1 tablet by mouth daily.    Past Month   LINOLEIC ACID-SUNFLOWER OIL PO Take by mouth daily.   Past Month   Omega-3 Fatty Acids (FISH OIL PO) Take 1 capsule by mouth daily.    04/05/2021   Prenatal Vit-Fe Fumarate-FA (PRENATAL MULTIVITAMIN) TABS tablet Take 1 tablet by mouth daily at 12 noon.   04/05/2021   Probiotic Product (PROBIOTIC PO) Take 1 capsule by mouth daily.    Past Month   VITAMIN D PO Take 1 capsule by mouth daily.    04/05/2021   albuterol (VENTOLIN HFA) 108 (90 Base) MCG/ACT inhaler INHALE 1 PUFF INTO THE LUNGS EVERY 6 HOURS AS NEEDED 18 g 0 Unknown    Review of Systems: Review of Systems  All other systems reviewed and are negative.  Physical Exam: Vital signs and nursing notes reviewed.  Patient Vitals for the past 24 hrs:  BP Temp Temp src Pulse Resp Height Weight  04/05/21 2057 117/75 98.4 F (36.9 C) Oral 90 18 -- --  04/05/21 2052 -- -- -- -- -- 5\' 7"  (1.702 m) 77.1 kg    General: AAO x 3, NAD Heart: RRR Lungs:CTAB Abdomen: Gravid, NT Extremities: no edema Genitalia / VE: Dilation: 5 Effacement (%): 80 Cervical Position: Posterior Station: -2 Presentation: Vertex Exam by:: [email or web address], CNM  BBOW  FHR: 145 BPM, mod variability, + accels, no decels TOCO: Contractions 2-3 minutes  Labs:   Pending T&S, CBC, RPR  No results for input(s):  WBC, HGB, HCT, PLT in the last 72 hours.  Assessment:  30 y.o. G2P1001 at [redacted]w[redacted]d, spontaneous labor  1. Active stage of labor 2. FHR category 1 3. GBS positive 4. Desires epidural 5. Plans to breastfeed 6. History of PP hyperthyroidism, normal TFTs through pregnancy  Plan:  1. Admit to BS 2. Routine L&D orders 3. Analgesia/anesthesia PRN  4. Ampicillin for GBS due to advanced labor 5. Expectant management 6. Anticipate NSVB  Dr. Amado Nash notified of admission/plan of care.  June Leap CNM, MSN 04/05/2021, 9:31 PM

## 2021-04-05 NOTE — MAU Note (Addendum)
Contractions all afternoon, for last hour or two, they are every 5 mins.  Water broke at 1930, clear fluid with red streaks. Baby moving well. 2 cm at last exam. GBS positive

## 2021-04-05 NOTE — Anesthesia Procedure Notes (Addendum)
Epidural Patient location during procedure: OB Start time: 04/05/2021 10:01 PM End time: 04/05/2021 10:13 PM  Staffing Anesthesiologist: Trevor Iha, MD Performed: anesthesiologist   Preanesthetic Checklist Completed: patient identified, IV checked, site marked, risks and benefits discussed, surgical consent, monitors and equipment checked, pre-op evaluation and timeout performed  Epidural Patient position: sitting Prep: DuraPrep and site prepped and draped Patient monitoring: continuous pulse ox and blood pressure Approach: midline Location: L3-L4 Injection technique: LOR air  Needle:  Needle type: Tuohy  Needle gauge: 18 G Needle length: 9 cm and 9 Needle insertion depth: 6 cm Catheter type: closed end flexible Catheter size: 20 Guage Catheter at skin depth: 12 cm Test dose: negative  Assessment Events: blood not aspirated, injection not painful, no injection resistance, no paresthesia and negative IV test  Additional Notes Patient identified. Risks/Benefits/Options discussed with patient including but not limited to bleeding, infection, nerve damage, paralysis, failed block, incomplete pain control, headache, blood pressure changes, nausea, vomiting, reactions to medication both or allergic, itching and postpartum back pain. Confirmed with bedside nurse the patient's most recent platelet count. Confirmed with patient that they are not currently taking any anticoagulation, have any bleeding history or any family history of bleeding disorders. Patient expressed understanding and wished to proceed. All questions were answered. Sterile technique was used throughout the entire procedure. Please see nursing notes for vital signs. Test dose was given through epidural needle and negative prior to continuing to dose epidural or start infusion. Warning signs of high block given to the patient including shortness of breath, tingling/numbness in hands, complete motor block, or any  concerning symptoms with instructions to call for help. Patient was given instructions on fall risk and not to get out of bed. All questions and concerns addressed with instructions to call with any issues.  1 Attempt (S) . Patient tolerated procedure well.

## 2021-04-06 ENCOUNTER — Encounter (HOSPITAL_COMMUNITY): Payer: Self-pay | Admitting: Obstetrics and Gynecology

## 2021-04-06 LAB — CBC
HCT: 36.6 % (ref 36.0–46.0)
Hemoglobin: 12.5 g/dL (ref 12.0–15.0)
MCH: 32.6 pg (ref 26.0–34.0)
MCHC: 34.2 g/dL (ref 30.0–36.0)
MCV: 95.3 fL (ref 80.0–100.0)
Platelets: 173 10*3/uL (ref 150–400)
RBC: 3.84 MIL/uL — ABNORMAL LOW (ref 3.87–5.11)
RDW: 13.2 % (ref 11.5–15.5)
WBC: 17.8 10*3/uL — ABNORMAL HIGH (ref 4.0–10.5)
nRBC: 0 % (ref 0.0–0.2)

## 2021-04-06 LAB — RPR: RPR Ser Ql: NONREACTIVE

## 2021-04-06 MED ORDER — WITCH HAZEL-GLYCERIN EX PADS: 1.0000 "application " | MEDICATED_PAD | CUTANEOUS | Status: DC | PRN

## 2021-04-06 MED ORDER — ONDANSETRON HCL 4 MG/2ML IJ SOLN: 4.0000 mg | INTRAMUSCULAR | Status: DC | PRN

## 2021-04-06 MED ORDER — BENZOCAINE-MENTHOL 20-0.5 % EX AERO: 1.0000 "application " | INHALATION_SPRAY | CUTANEOUS | Status: DC | PRN

## 2021-04-06 MED ORDER — DIPHENHYDRAMINE HCL 25 MG PO CAPS: 25.0000 mg | ORAL_CAPSULE | Freq: Four times a day (QID) | ORAL | Status: DC | PRN

## 2021-04-06 MED ORDER — DIBUCAINE (PERIANAL) 1 % EX OINT: 1.0000 "application " | TOPICAL_OINTMENT | CUTANEOUS | Status: DC | PRN

## 2021-04-06 MED ORDER — ACETAMINOPHEN 325 MG PO TABS: 650.0000 mg | ORAL_TABLET | ORAL | Status: DC | PRN

## 2021-04-06 MED ORDER — TETANUS-DIPHTH-ACELL PERTUSSIS 5-2.5-18.5 LF-MCG/0.5 IM SUSY: 0.5000 mL | PREFILLED_SYRINGE | Freq: Once | INTRAMUSCULAR | Status: DC

## 2021-04-06 MED ORDER — IBUPROFEN 600 MG PO TABS
600.0000 mg | ORAL_TABLET | Freq: Four times a day (QID) | ORAL | Status: DC
  Administered 2021-04-06 – 2021-04-07 (×7): 600 mg via ORAL
  Filled 2021-04-06 (×7): qty 1

## 2021-04-06 MED ORDER — PRENATAL MULTIVITAMIN CH
1.0000 | ORAL_TABLET | Freq: Every day | ORAL | Status: DC
  Administered 2021-04-06 – 2021-04-07 (×2): 1 via ORAL
  Filled 2021-04-06 (×2): qty 1

## 2021-04-06 MED ORDER — COCONUT OIL OIL: 1.0000 "application " | TOPICAL_OIL | Status: DC | PRN

## 2021-04-06 MED ORDER — SIMETHICONE 80 MG PO CHEW: 80.0000 mg | CHEWABLE_TABLET | ORAL | Status: DC | PRN

## 2021-04-06 MED ORDER — SENNOSIDES-DOCUSATE SODIUM 8.6-50 MG PO TABS
2.0000 | ORAL_TABLET | Freq: Every day | ORAL | Status: DC
  Administered 2021-04-07: 2 via ORAL
  Filled 2021-04-06: qty 2

## 2021-04-06 MED ORDER — ZOLPIDEM TARTRATE 5 MG PO TABS: 5.0000 mg | ORAL_TABLET | Freq: Every evening | ORAL | Status: DC | PRN

## 2021-04-06 MED ORDER — ONDANSETRON HCL 4 MG PO TABS: 4.0000 mg | ORAL_TABLET | ORAL | Status: DC | PRN

## 2021-04-06 NOTE — Lactation Note (Addendum)
This note was copied from a baby's chart. Lactation Consultation Note  Patient Name: Veronica Figueroa Date: 04/06/2021 Reason for consult: Initial assessment;Term Age:30 years P2, term female infant.  Mom shared her 1st breastfeeding experience with LC, daughter had tongue tie, mom developed mastitis and then had abscess, mom still breastfeed her 1st child for one year.  Mom knows each birth and breastfeeding experience may be different , LC let mom know she heard all of her concerns and past experience with her daughter. LC praised mom that she breastfeed her daughter for one year.  Per mom, after arriving to [facility] last night infant has been sleepy and spitty ( a lot of emesis) mom has been doing skin to skin with infant.  Per mom, infant  recently start back breastfeeding, infant  breastfed for 20 minutes at 1500 pm, recently finished breastfeeding  for 11 minutes prior to [facility] entering the room, LC did not observe the latch.  Mom was burping infant.  when  [employer] [employer]  was in the  room, infant had large emesis ( clear mucous).  Mom was given hand pump for prn use. LC discussed infant' input and output with mom. Mom knows it is not uncommon for infant to be spitty the first 24 hours of life, mom will do lots of skin to skin, burping and if infant doesn't latch, mom knows how to hand express and give infant by her EBM.  Mom made aware of O/P services, breastfeeding support groups, community resources, and our phone # for post-discharge questions.   Mom's plan: 1- Mom will continue to breastfeed infant according to feeding cues, 8 to 12+ times within 24 hours, doing lots of skin to skin with infant. 2- Mom will attempt to latch infant on both breast and do a lot of burping, mom knows to suction infant if he becomes spitty. 3- Mom will call RN/ LC if she has any questions, concerns or need assistance with latching infant at the breast.  Maternal Data Has patient been taught Hand Expression?: Yes Does  the patient have breastfeeding experience prior to this delivery?: Yes  Feeding Mother's Current Feeding Choice: Breast Milk  LATCH Score                    Lactation Tools Discussed/Used Tools: Pump Breast pump type: Manual Pump Education: Setup, frequency, and cleaning;Milk Storage Reason for Pumping: prn  Interventions Interventions: Skin to skin;Position options;Hand express;Education;Hand pump  Discharge    Consult Status Consult Status: Follow-up Date: 04/07/21 Follow-up type: In-patient    [name] 04/06/2021, 4:58 PM

## 2021-04-06 NOTE — Anesthesia Postprocedure Evaluation (Signed)
Anesthesia Post Note  Patient: Veronica Figueroa  Procedure(s) Performed: AN AD HOC LABOR EPIDURAL     Patient location during evaluation: Mother Baby Anesthesia Type: Epidural Level of consciousness: awake Pain management: satisfactory to patient Vital Signs Assessment: post-procedure vital signs reviewed and stable Respiratory status: spontaneous breathing Cardiovascular status: stable Anesthetic complications: no   No notable events documented.  Last Vitals:  Vitals:   04/06/21 0355 04/06/21 0505  BP: 101/70 113/72  Pulse: 78 100  Resp: 18 18  Temp: 36.7 C 36.8 C  SpO2: 100% 100%    Last Pain:  Vitals:   04/06/21 0505  TempSrc: Oral  PainSc: 2    Pain Goal: Patients Stated Pain Goal: 0 (04/05/21 2054)                 Cephus Shelling

## 2021-04-06 NOTE — Lactation Note (Signed)
This note was copied from a baby's chart. Lactation Consultation Note  Patient Name: Veronica Figueroa Date: 04/06/2021 Reason for consult: L&D Initial assessment Age:31 hours LC entered the room in L&D, mom was doing skin to skin with infant. Mom latched infant on her left breast using the football hold position, infant latched with depth, swallows observed and infant was still breastfeed after 14 minutes when LC left the room. LC unable assess infant's oral cavity at this time, mom will ask Pediatrician due to her daughter having tight  and restricted frenulum. Mom knows to breastfeed infant according to primal feeding cues: licking, kissing, tasting, smacking, hands and fist in mouth, skin to skin. Mom knows to call RN/LC on MBU if she has breastfeeding questions, concerns or needs assistance with latching infant at the breast.  Maternal Data Has patient been taught Hand Expression?: Yes Does the patient have breastfeeding experience prior to this delivery?: Yes How long did the patient breastfeed?: Per mom, she breastfeed her 37 month old daughter for one year  Feeding Mother's Current Feeding Choice: Breast Milk  LATCH Score Latch: Grasps breast easily, tongue down, lips flanged, rhythmical sucking.  Audible Swallowing: Spontaneous and intermittent  Type of Nipple: Everted at rest and after stimulation  Comfort (Breast/Nipple): Soft / non-tender  Hold (Positioning): Assistance needed to correctly position infant at breast and maintain latch.  LATCH Score: 9   Lactation Tools Discussed/Used    Interventions Interventions: Breast feeding basics reviewed;Assisted with latch;Breast compression;Adjust position;Skin to skin;Support pillows;Breast massage;Position options;Education  Discharge Pump: Personal WIC Program: No  Consult Status Consult Status: Follow-up Date: 04/06/21 Follow-up type: In-patient    [name] 04/06/2021, 2:01 AM

## 2021-04-07 MED ORDER — ACETAMINOPHEN 325 MG PO TABS: 650.0000 mg | ORAL_TABLET | ORAL | 1 refills | Status: DC | PRN

## 2021-04-07 MED ORDER — IBUPROFEN 600 MG PO TABS: 600.0000 mg | ORAL_TABLET | Freq: Four times a day (QID) | ORAL | 0 refills | Status: DC

## 2021-04-07 NOTE — Discharge Summary (Signed)
OB Discharge Summary  Patient Name: Veronica Figueroa DOB: 01-Apr-1991 MRN: [id number]  Date of admission: 04/05/2021 Delivering provider: Dorisann Frames K   Admitting diagnosis: Normal labor [O80, Z37.9] Intrauterine pregnancy: [redacted]w[redacted]d     Secondary diagnosis: Patient Active Problem List   Diagnosis Date Noted   SVD 9/4 04/06/2021   Postpartum care following vaginal delivery 9/4 04/06/2021   First degree vaginal laceration 04/06/2021   Normal labor 04/05/2021   Positive GBS test 04/05/2021   History of hyperthyroidism 04/05/2021    Date of discharge: 04/07/2021   Discharge diagnosis: Principal Problem:   Postpartum care following vaginal delivery 9/4 Active Problems:   Normal labor   Positive GBS test   History of hyperthyroidism   SVD 9/4   First degree vaginal laceration                                                            Post partum procedures: None  Augmentation: AROM Pain control: Epidural  Laceration:1st degree;Vaginal  Episiotomy:None  Complications: None  Hospital course:  Onset of Labor With Vaginal Delivery      30 y.o. yo [id number] at [redacted]w[redacted]d was admitted in Active Labor on 04/05/2021. Patient had an uncomplicated labor course as follows:  Membrane Rupture Time/Date: 10:57 PM ,04/05/2021   Delivery Method:Vaginal, Spontaneous  Episiotomy: None  Lacerations:  1st degree;Vaginal  Patient had an uncomplicated postpartum course.  She is ambulating, tolerating a regular diet, passing flatus, and urinating well. Patient is discharged home in stable condition on 04/07/21.  Newborn Data: Birth date:04/06/2021  Birth time:12:20 AM  Gender:Female  Living status:Living  Apgars:9 ,9  Weight:3790 g   Physical exam  Vitals:   04/06/21 1425 04/06/21 1845 04/06/21 2115 04/07/21 0522  BP: 100/69 104/65 105/72 109/79  Pulse: 76 61 65 60  Resp: 16 16 18 18   Temp: 97.8 F (36.6 C) 98.4 F (36.9 C) 97.8 F (36.6 C) 97.9 F (36.6 C)  TempSrc: Axillary Oral Oral Oral  SpO2:   99%  99%  Weight:      Height:       General: alert, cooperative, and no distress Lochia: appropriate Uterine Fundus: firm Perineum: repair no DVT Evaluation: No evidence of DVT seen on physical exam. No significant calf/ankle edema. Labs: Lab Results  Component Value Date   WBC 17.8 (H) 04/06/2021   HGB 12.5 04/06/2021   HCT 36.6 04/06/2021   MCV 95.3 04/06/2021   PLT 173 04/06/2021   CMP Latest Ref Rng & Units 01/25/2020  Glucose 65 - 139 mg/dL 92  BUN 7 - 25 mg/dL 20  Creatinine 01/27/2020 - 4.26 mg/dL 8.34  Sodium 1.96 - 222 mmol/L 139  Potassium 3.5 - 5.3 mmol/L 4.3  Chloride 98 - 110 mmol/L 103  CO2 20 - 32 mmol/L 31  Calcium 8.6 - 10.2 mg/dL 9.6   Edinburgh Postnatal Depression Scale Screening Tool 04/06/2021 08/17/2019  I have been able to laugh and see the funny side of things. 0 0  I have looked forward with enjoyment to things. 0 0  I have blamed myself unnecessarily when things went wrong. 1 2  I have been anxious or worried for no good reason. 1 1  I have felt scared or panicky for no good reason. 0 0  Things have been getting  on top of me. 1 1  I have been so unhappy that I have had difficulty sleeping. 0 0  I have felt sad or miserable. 0 0  I have been so unhappy that I have been crying. 0 1  The thought of harming myself has occurred to me. 0 0  Edinburgh Postnatal Depression Scale Total 3 5   Discharge instructions:  per After Visit Summary   After Visit Meds:  Allergies as of 04/07/2021   No Known Allergies      Medication List     TAKE these medications    acetaminophen 325 MG tablet Commonly known as: Tylenol Take 2 tablets (650 mg total) by mouth every 4 (four) hours as needed (for pain scale < 4).   albuterol 108 (90 Base) MCG/ACT inhaler Commonly known as: VENTOLIN HFA INHALE 1 PUFF INTO THE LUNGS EVERY 6 HOURS AS NEEDED   CALCIUM PO Take 1 tablet by mouth daily.   FISH OIL PO Take 1 capsule by mouth daily.   ibuprofen 600 MG  tablet Commonly known as: ADVIL Take 1 tablet (600 mg total) by mouth every 6 (six) hours.   LINOLEIC ACID-SUNFLOWER OIL PO Take by mouth daily.   prenatal multivitamin Tabs tablet Take 1 tablet by mouth daily at 12 noon.   PROBIOTIC PO Take 1 capsule by mouth daily.   VITAMIN D PO Take 1 capsule by mouth daily.       Diet: routine diet  Activity: Advance as tolerated. Pelvic rest for 6 weeks.   Newborn Data: Live born female  Birth Weight: 8 lb 5.7 oz (3790 g) APGAR: 9, 9  Newborn Delivery   Birth date/time: 04/06/2021 00:20:00 Delivery type: Vaginal, Spontaneous     Named Kellan Baby Feeding: Breast Disposition:home with mother  Delivery Report:  Review the Delivery Report for details.    Follow up:  Follow-up Information     June Leap, CNM. Schedule an appointment as soon as possible for a visit in 2 week(s).   Specialty: Certified Nurse Midwife Contact information: [address] [place] [address] [phone]                June Leap, CNM, MSN 04/07/2021, 9:27 AM

## 2021-04-07 NOTE — Lactation Note (Signed)
This note was copied from a baby's chart. Lactation Consultation Note  Patient Name: Veronica Figueroa Date: 04/07/2021 Reason for consult: Follow-up assessment;Term;Infant weight loss;Other (Comment) (7 % weight loss, per mom mentioned the doctor felt the baby had a tongue - tie.  32 month old had a tongue - tie. baby fed prior to circ.  Mom  aware to page with feeding cues for Tongue mobility assessment /latch check - post circ.) Age:31 hours  Maternal Data    Feeding Mother's Current Feeding Choice: Breast Milk  LATCH Score Latch: Grasps breast easily, tongue down, lips flanged, rhythmical sucking.  Audible Swallowing: A few with stimulation  Type of Nipple: Everted at rest and after stimulation  Comfort (Breast/Nipple): Soft / non-tender  Hold (Positioning): No assistance needed to correctly position infant at breast.  LATCH Score: 9   Lactation Tools Discussed/Used Tools: Pump Breast pump type: Manual Pump Education: Milk Storage  Interventions Interventions: Breast feeding basics reviewed;Education;Hand pump  Discharge Discharge Education: Engorgement and breast care;Warning signs for feeding baby Pump: Personal;Manual;DEBP  Consult Status Consult Status: Follow-up Date: 04/07/21 Follow-up type: In-patient    Veronica Figueroa 04/07/2021, 10:52 AM

## 2021-04-07 NOTE — Lactation Note (Signed)
This note was copied from a baby's chart. Lactation Consultation Note  Patient Name: Boy Pierra Reid Nawrot Date: 04/07/2021 Reason for consult: Follow-up assessment;Term;Infant weight loss;Other (Comment) (7 % weight loss / Tongue mobility challenge/ as LC entered the room baby was latched / slightly shallow/ per mom had been feeding for abot 15 mins with swallows/ when baby released the nipple was slightly creased. see LC note for oral assessment eval.) Age:23 hours  LC assessed the baby oral cavity with gloved fingers after baby finished feeding.  Upper lip stretches / the skin notch under the upper lip was short slightly above the gum line.  Notch noted under the mid portion of the tongue and a indentation/ slight lateralization both sides /.   Mom is familiar with Tongue mobility issues with her 1st baby.  LC recommended calling for oral specialist for full evaluation tomorrow and also recommended LC O/P .  Mom receptive for [facility] placing a request in Epic for the clinic to call mom for appt this week .  LC placed a request.  Mom reassured mom and recommended to call the [facility] office if questions.   Maternal Data    Feeding Mother's Current Feeding Choice: Breast Milk  LATCH Score Latch:  (baby latched - slightly shallow)  Audible Swallowing:  (per mom swallows)  Type of Nipple:  (nipple slightly creased when baby released)  Comfort (Breast/Nipple):  (per mom feeding comfortable)  Hold (Positioning):  (mom independent with latch.)      Lactation Tools Discussed/Used Tools: Shells;Pump Breast pump type: Manual Pump Education: Milk Storage Reason for Pumping: LC recommended due to tongue mobility issue - prior to latch - breast massage, hand express, pre - pump with hand pump to prime the milk ducts and enhance the milk flow for the baby to get into a more consistent feeding pattern .  Interventions Interventions: Breast feeding basics  reviewed;Education  Discharge Discharge Education: Engorgement and breast care;Warning signs for feeding baby;Outpatient recommendation;Outpatient Epic message sent Pump: Personal;Manual;DEBP  Consult Status Consult Status: Complete Date: 04/07/21 Follow-up type: In-patient    Matilde Sprang Fletcher Rathbun 04/07/2021, 1:21 PM

## 2021-04-08 ENCOUNTER — Telehealth: Payer: Self-pay

## 2021-04-08 NOTE — Telephone Encounter (Signed)
Transition Care Management Follow-up Telephone Call Date of discharge and from where: 04/07/2021 from [facility] How have you been since you were released from the hospital? Pt states that she is doing well and did not have any questions or concerns at this time.  Any questions or concerns? No  Items Reviewed: Did the pt receive and understand the discharge instructions provided? Yes  Medications obtained and verified? Yes  Other? No  Any new allergies since your discharge? No  Dietary orders reviewed? No Do you have support at home? Yes   Functional Questionnaire: (I = Independent and D = Dependent) ADLs: I  Bathing/Dressing- I  Meal Prep- I  Eating- I  Maintaining continence- I  Transferring/Ambulation- I  Managing Meds- I   Follow up appointments reviewed:  PCP Hospital f/u appt confirmed? No  Specialist Hospital f/u appt confirmed? Yes   Are transportation arrangements needed? No  If their condition worsens, is the pt aware to call PCP or go to the Emergency Dept.? Yes Was the patient provided with contact information for the PCP's office or ED? Yes Was to pt encouraged to call back with questions or concerns? Yes

## 2021-04-21 ENCOUNTER — Telehealth (HOSPITAL_COMMUNITY): Payer: Self-pay | Admitting: *Deleted

## 2021-04-21 NOTE — Telephone Encounter (Signed)
Mom reports feeling well. No concerns about herself. EPDS=6 (hospital score=3) Mom reports baby is fine. Has appt for check up today. Feeding well. Peeing and pooping without difficulty. No concens about baby.  Duffy Rhody, RN 04-21-2021 at 9:42am

## 2021-04-24 IMAGING — US US BREAST CYST ASPIRATION 1ST CYST
1 series · 5 of 5 positions shown · non-contrast
Comparison: Previous exams.
COMPARISON: Previous exams.

Addendum:
CLINICAL DATA: Left breast abscess aspiration.

EXAM:
ULTRASOUND GUIDED LEFT BREAST CYST ASPIRATION

[Series 1: us breast cyst aspiration 1st cyst · 0.07mm/px · 5 of 5 slices shown]
[im 1/5]
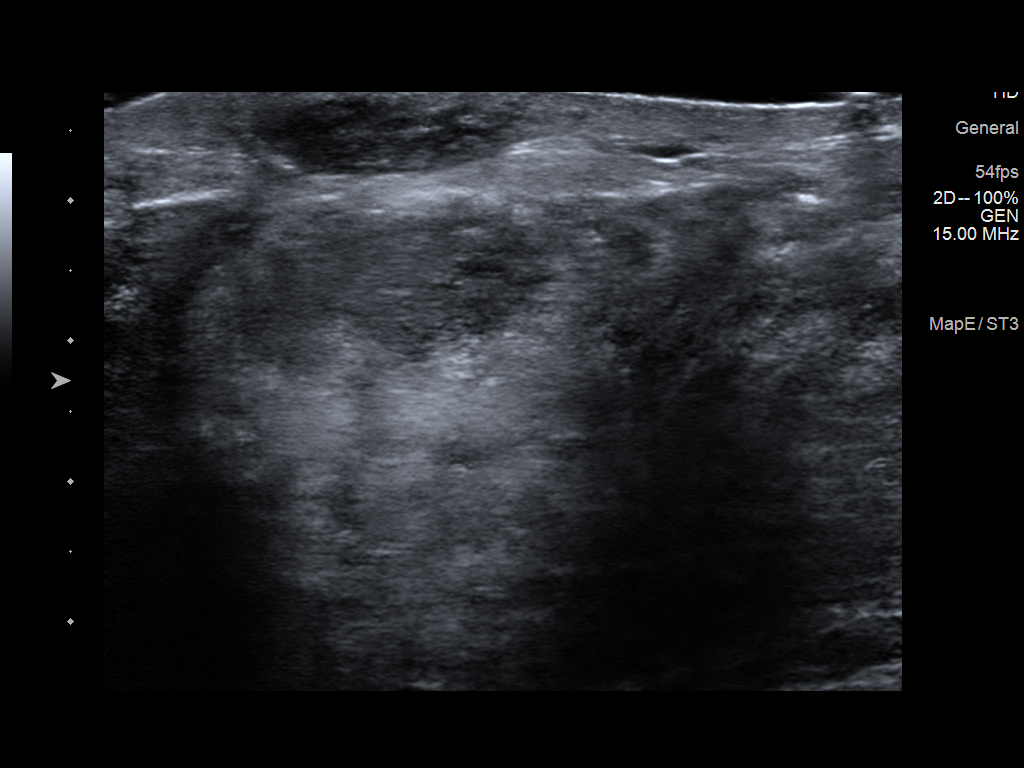
[im 2/5]
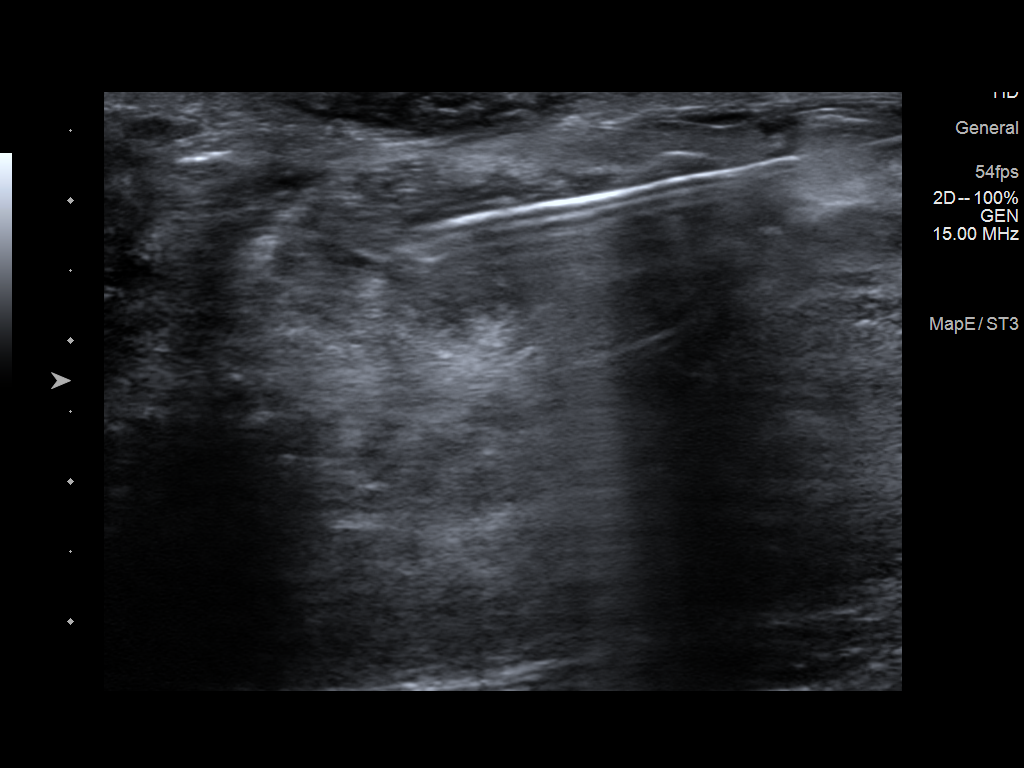
[im 3/5]
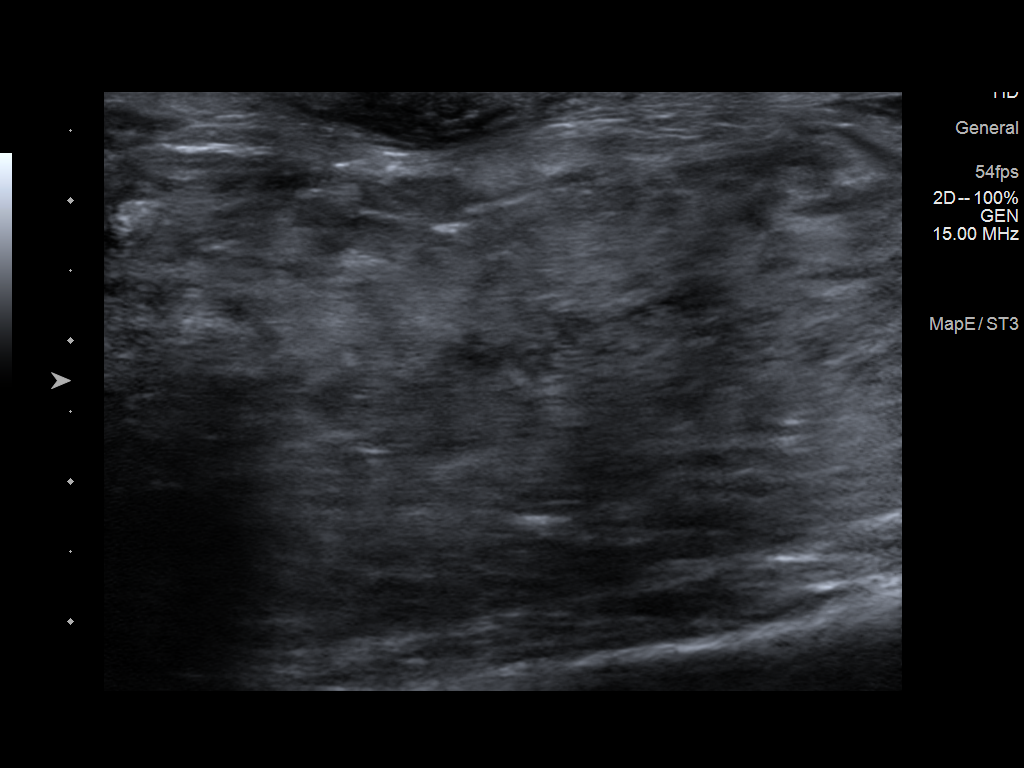
[im 4/5]
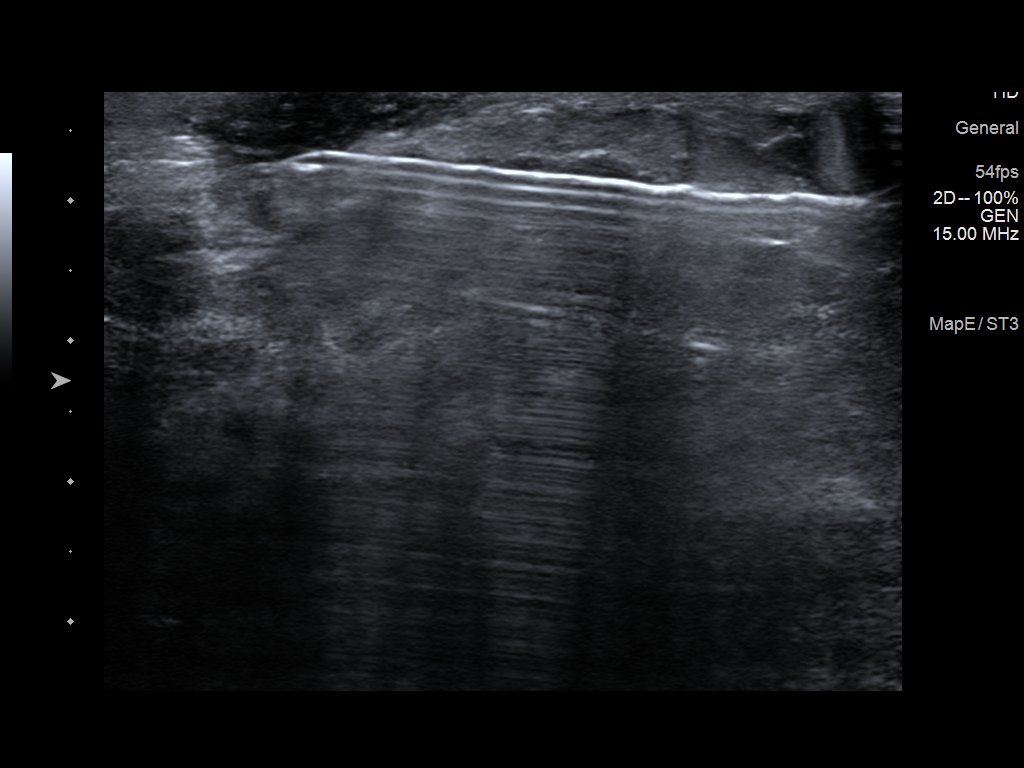
[im 5/5]
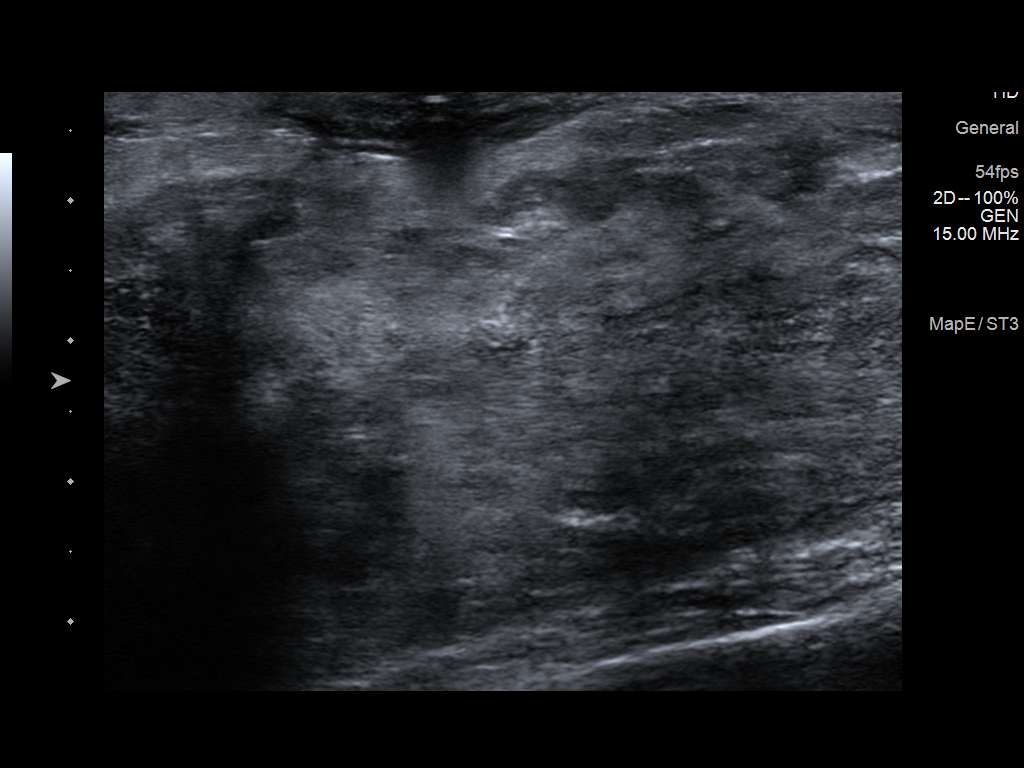

[5 of 5 positions shown; findings below may reference images not displayed]

PROCEDURE:
Using sterile technique, 1% lidocaine, under direct ultrasound
visualization, needle aspiration of the left breast abscess was
performed. 3 cc of purulent material was aspirated from the portion
of abscess within the breast. No aspirate could be obtained from the
portion of abscess within the posterior aspect of the skin.
IMPRESSION: Ultrasound-guided aspiration of 3 cc of purulence material from the
left breast abscess. No apparent complications.

RECOMMENDATIONS:
Recommend follow-up ultrasound in 1 week. Recommend continuing the
antibiotics prescribed by the referring physician. The aspirate will
be sent for culture and sensitivity.

ADDENDUM:
BI-RADS category 2-benign

*** End of Addendum ***
PROCEDURE:
Using sterile technique, 1% lidocaine, under direct ultrasound
visualization, needle aspiration of the left breast abscess was
performed. 3 cc of purulent material was aspirated from the portion
of abscess within the breast. No aspirate could be obtained from the
portion of abscess within the posterior aspect of the skin.
IMPRESSION: Ultrasound-guided aspiration of 3 cc of purulence material from the
left breast abscess. No apparent complications.

RECOMMENDATIONS:
Recommend follow-up ultrasound in 1 week. Recommend continuing the
antibiotics prescribed by the referring physician. The aspirate will
be sent for culture and sensitivity.

## 2021-04-24 IMAGING — US US BREAST*L* LIMITED INC AXILLA
1 series · 8 of 8 positions shown · non-contrast
Comparison: Previous exam(s).
COMPARISON: None

*** End of Addendum ***
COMPARISON: Previous exam(s).

Addendum:
CLINICAL DATA: Possible abscess.

EXAM:
ULTRASOUND OF THE LEFT BREAST

[Series 1: us breast*left* limited inc axilla · 0.07mm/px · 8 of 8 slices shown]
[im 1/8]
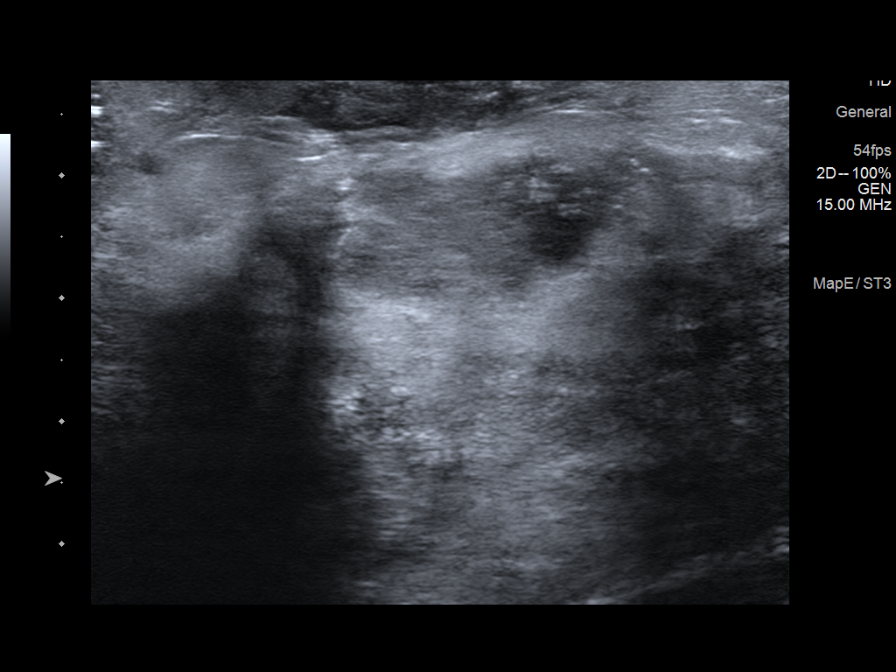
[im 2/8]
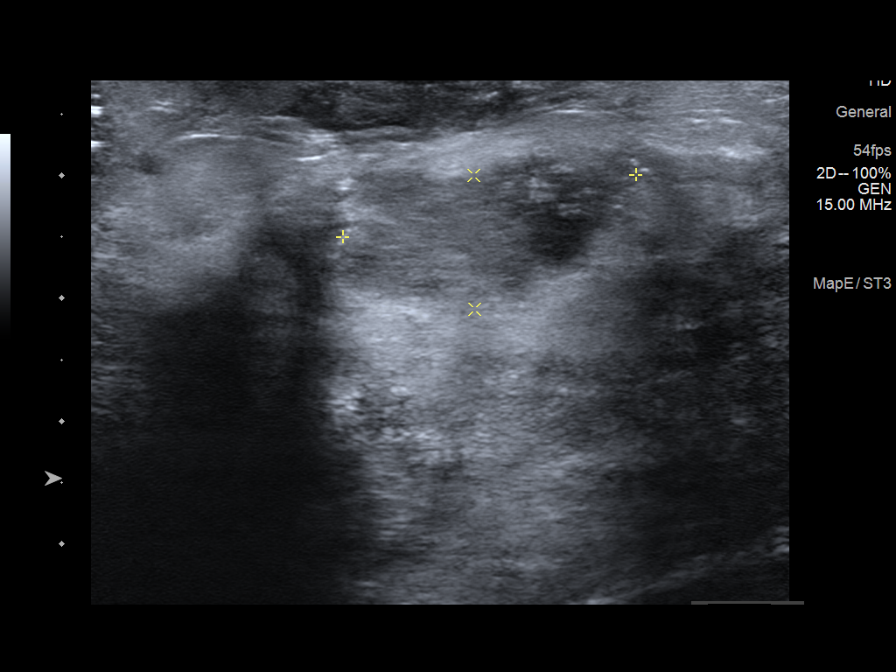
[im 3/8]
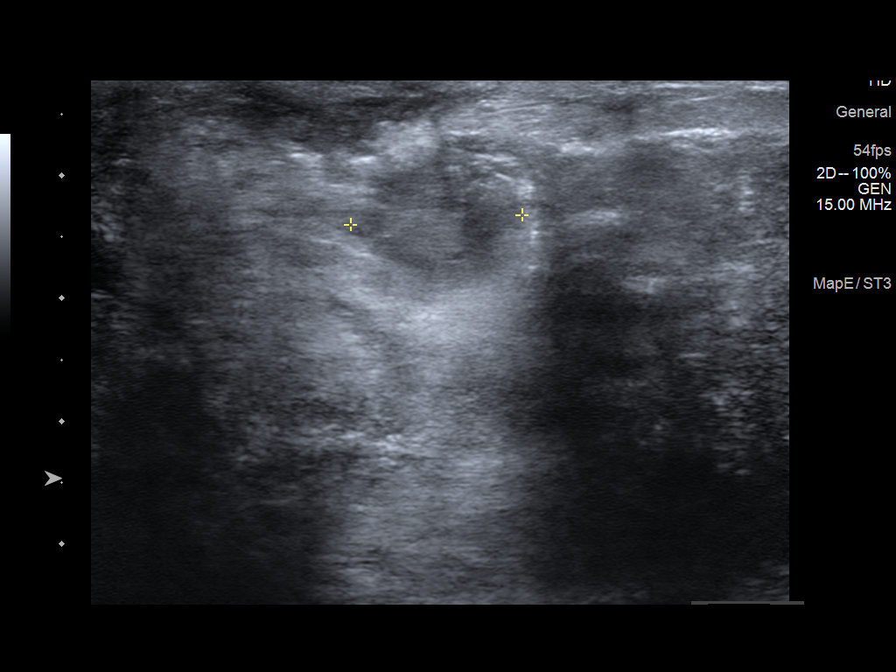
[im 4/8]
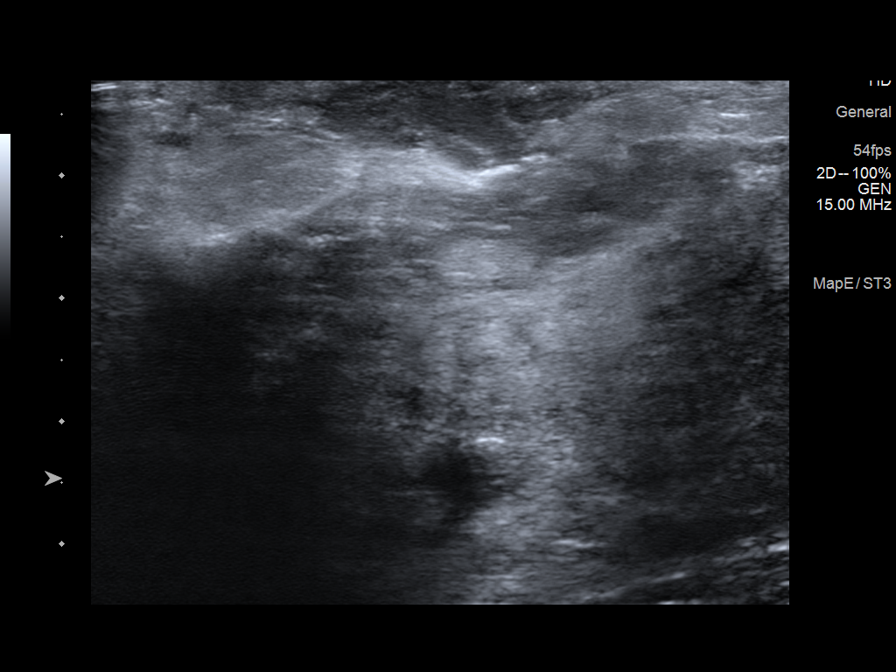
[im 5/8]
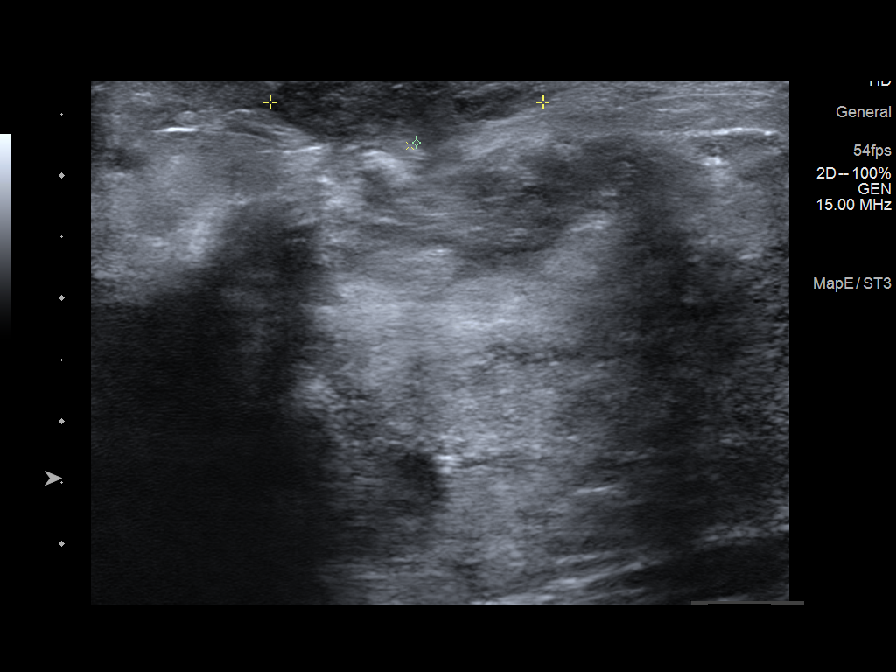
[im 6/8]
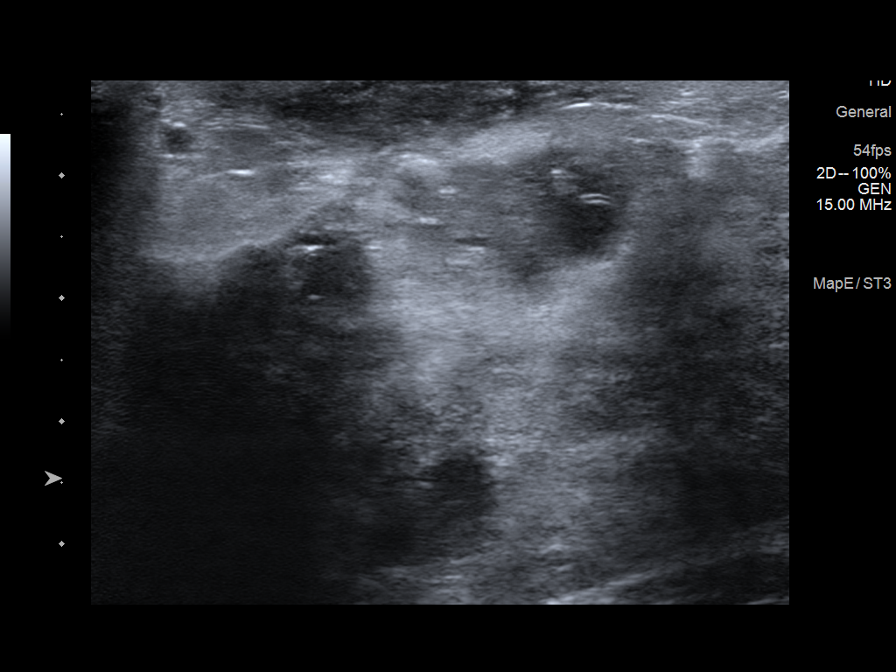
[im 7/8]
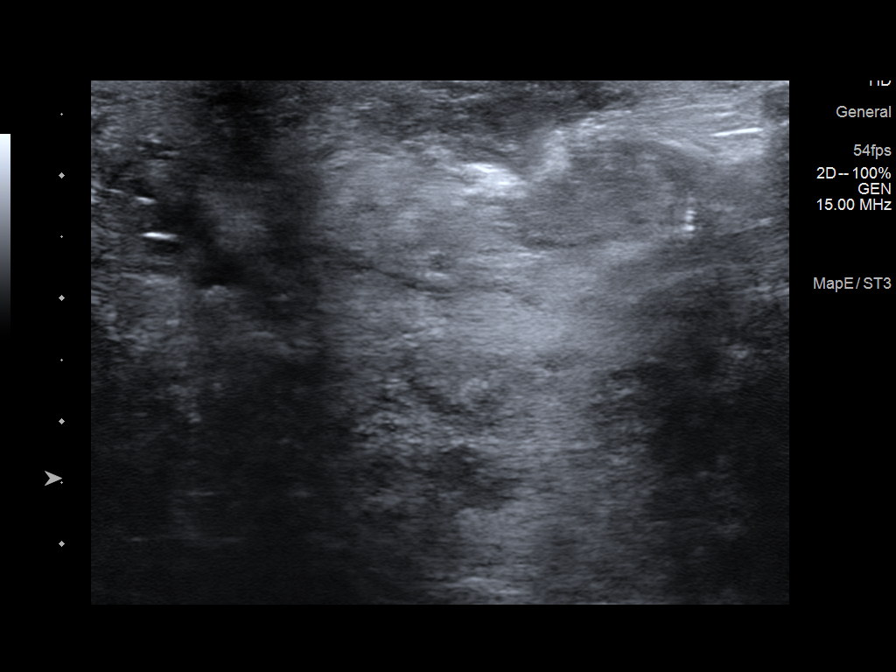
[im 8/8]
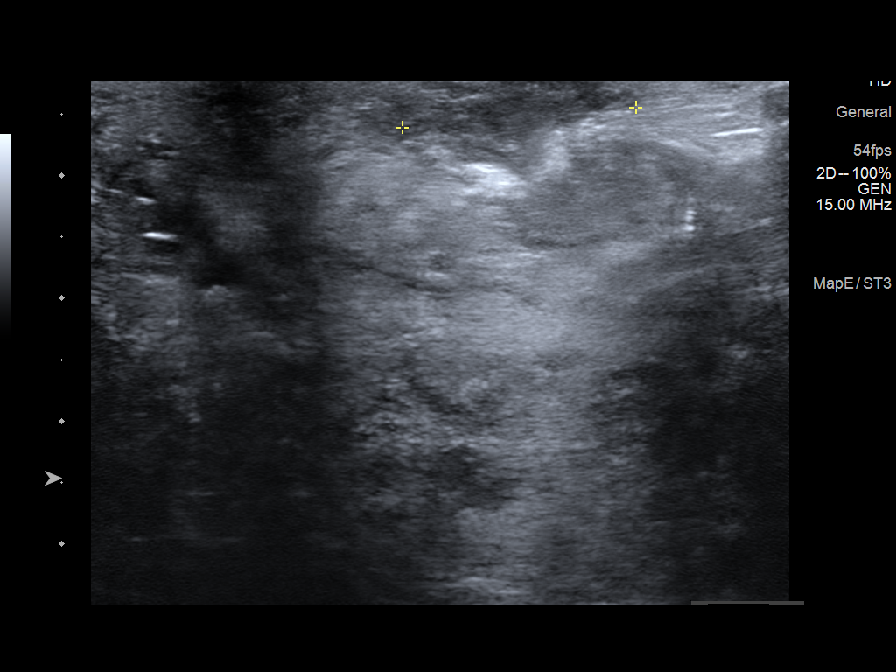

[8 of 8 positions shown; findings below may reference images not displayed]

FINDINGS: On physical exam, there is erythema in the left breast with a lump
at 12 o'clock.

Targeted ultrasound is performed, showing a collection of fluid
within the breast measuring 2.4 x 1.1 x 1.4 cm. This collection is
connected to a small collection in the skin measuring 2.2 x 0.6 by
1.9 cm.
IMPRESSION: Left breast abscess.

RECOMMENDATION:
Recommend attempted aspiration of the left breast abscess. The
patient is already on antibiotics, prescribed by the referring
physician.

I have discussed the findings and recommendations with the patient.
If applicable, a reminder letter will be sent to the patient
regarding the next appointment.

BI-RADS CATEGORY  2: Benign.
FINDINGS: On physical exam, there is erythema in the left breast with a lump
at 12 o'clock.

Targeted ultrasound is performed, showing a collection of fluid
within the breast measuring 2.4 x 1.1 x 1.4 cm. This collection is
connected to a small collection in the skin measuring 2.2 x 0.6 by
1.9 cm.
IMPRESSION: Left breast abscess.

RECOMMENDATION:
Recommend attempted aspiration of the left breast abscess. The
patient is already on antibiotics, prescribed by the referring
physician.

I have discussed the findings and recommendations with the patient.
If applicable, a reminder letter will be sent to the patient
regarding the next appointment.

BI-RADS CATEGORY  2: Benign.

## 2021-04-30 DIAGNOSIS — Z3689 Encounter for other specified antenatal screening: Secondary | ICD-10-CM | POA: Diagnosis not present

## 2021-04-30 DIAGNOSIS — Z8639 Personal history of other endocrine, nutritional and metabolic disease: Secondary | ICD-10-CM | POA: Diagnosis not present

## 2021-05-02 IMAGING — US US BREAST*L* LIMITED INC AXILLA
1 series · 7 of 7 positions shown · non-contrast
Comparison: September 20, 2019
COMPARISON: September 20, 2019
COMPARISON: September 20, 2019

Addendum:
CLINICAL DATA: 28-year-old patient breast feeding patient presents
for 1 week follow-up of the left breast after an abscess aspiration.
She continues to take her antibiotics that her physician prescribed
her, without any difficulty. Our history notes from September 20, 2019
indicate she is taking dicloxacillin. She cannot remember the name
of the antibiotic. She reports significant improvement in skin
erythema, tenderness, in the size of the lump and reports some
bruising of the left breast after aspiration. She is able to pump
milk from the left breast. She has no new areas of concern.

EXAM:
ULTRASOUND OF THE LEFT BREAST

[Series 1: us breast*left* limited inc axilla · 0.06mm/px · 7 of 7 slices shown]
[im 1/7]
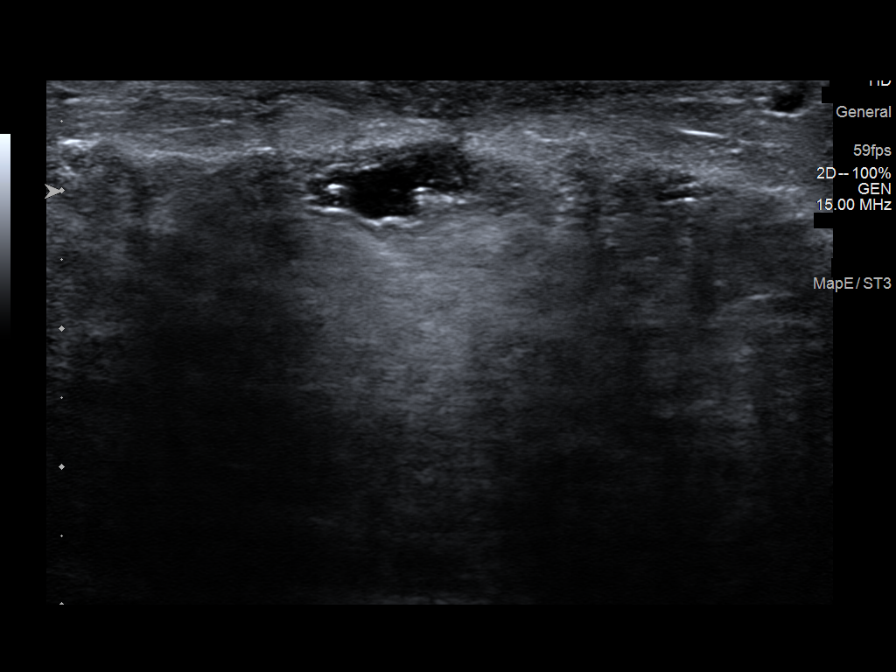
[im 2/7]
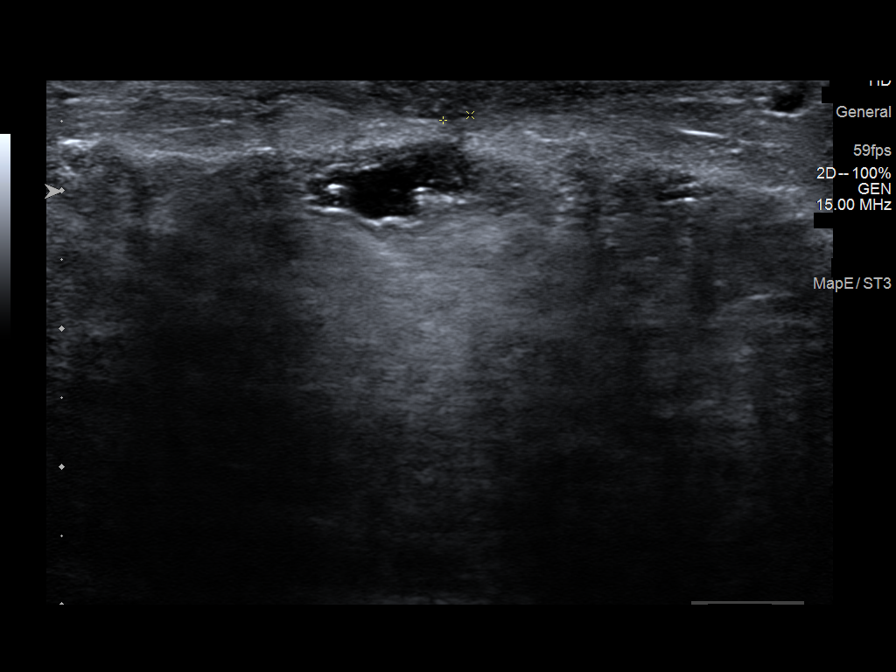
[im 3/7]
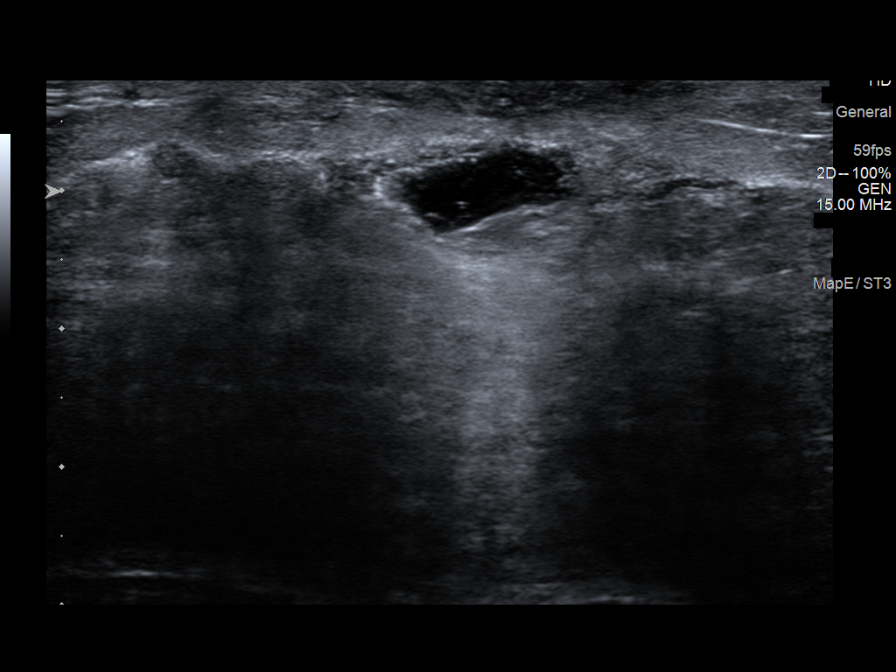
[im 4/7]
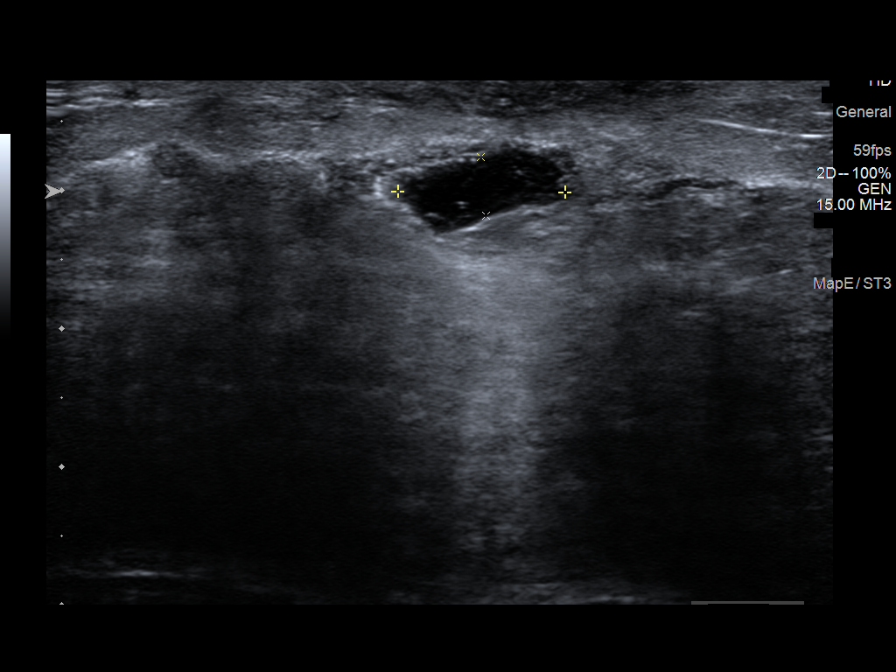
[im 5/7]
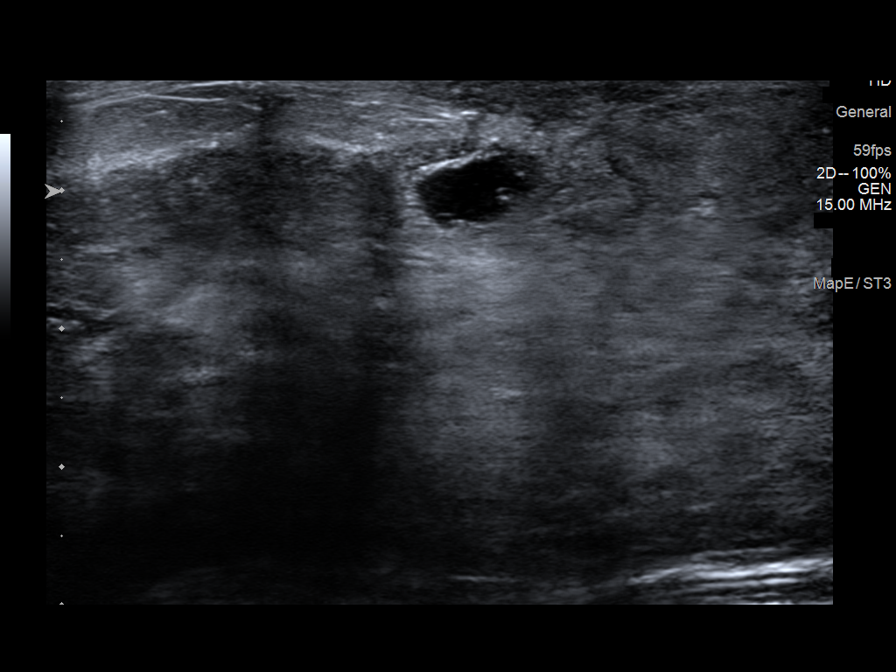
[im 6/7]
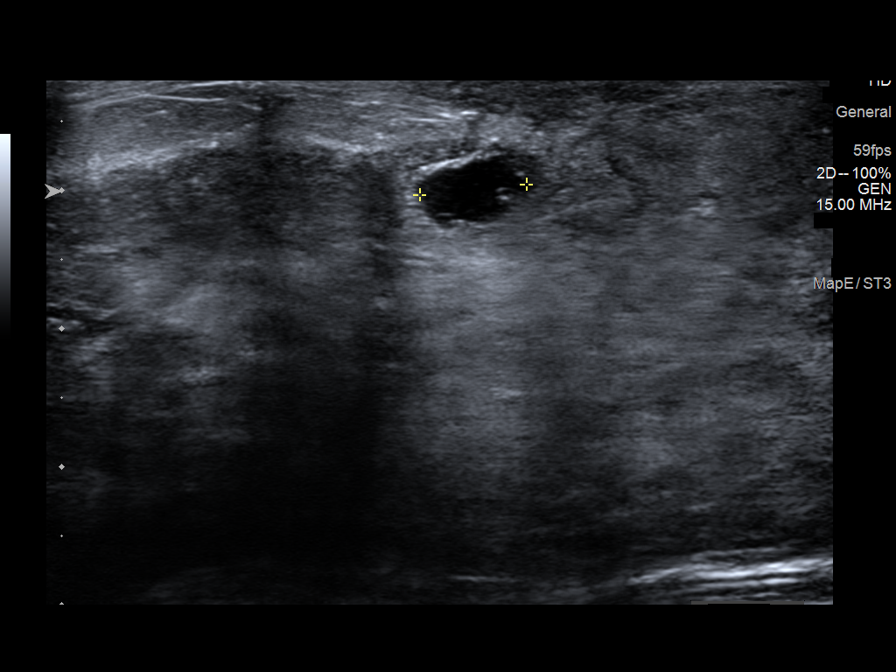
[im 7/7]
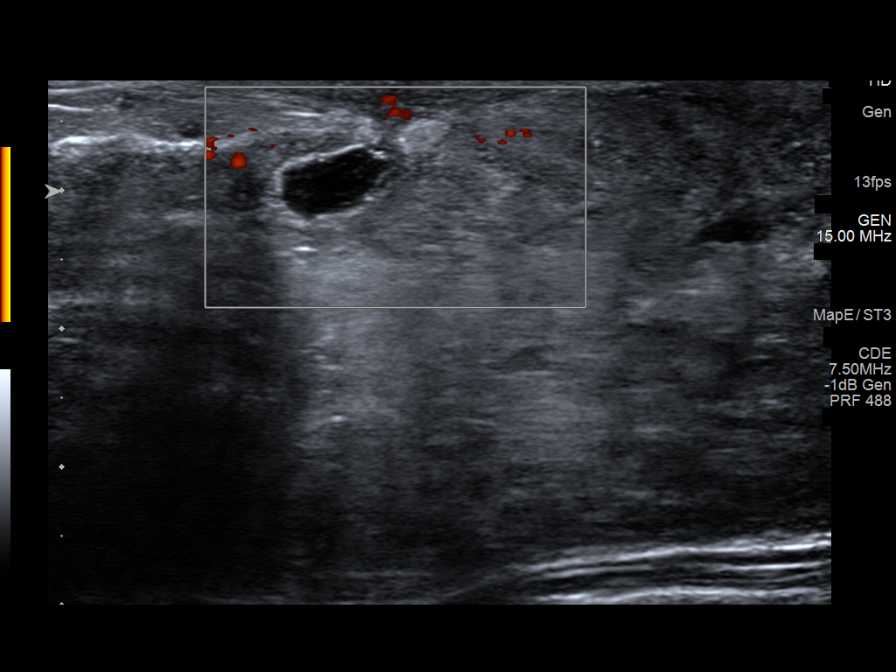

[7 of 7 positions shown; findings below may reference images not displayed]

FINDINGS: On physical exam, there is mild bruising of the medial periareolar
left breast. There is very mild residual erythema of the skin of the
left breast, and mild bruising of the periareolar medial left
breast. There is some palpable thickening in the 12 o'clock left
breast near the areola and up to 2 cm from the nipple.

Targeted ultrasound is performed, showing some residual skin
thickening in the region of the recent infection. At 12 o'clock
position 2 cm from nipple, the overall skin thickness measures up to
4.7 mm. The previously described complex fluid in the posterior
aspect of the dermis in this region has decreased in size currently
measuring 3 mm in depth (previously 6 mm in depth).

Currently, in the superficial breast parenchyma at 12 o'clock
position 2 cm from nipple is a 1.2 x 0.4 x 0.9 cm nearly anechoic
fluid collection with some mild internal floating debris.
Previously, this was a very complex appearing area that was
aspirated. On Doppler imaging, there is no peripheral hyperemia or
internal vascular flow.
IMPRESSION: Clinical and imaging improvement of the left breast infection.
Currently, there is not a complex fluid collection requiring
aspiration.

RECOMMENDATION:
Completion of antibiotic therapy and continued clinical follow-up to
evaluate for complete resolution of patient's symptoms. Further
imaging follow-up can be performed as clinically indicated.

Screening mammogram in one year.(Code:7G-6-I3Z)

I have discussed the findings and recommendations with the patient.
If applicable, a reminder letter will be sent to the patient
regarding the next appointment.

BI-RADS CATEGORY  2: Benign.

ADDENDUM:
LEFT breast aspiration yielded:

Gram Stain: ABUNDANT WBC PRESENT, PREDOMINANTLY PMN

ABUNDANT GRAM POSITIVE COCCI

Culture: MODERATE STAPHYLOCOCCUS AUREUS

NO ANAEROBES ISOLATED.   Organism ID, Bacteria STAPHYLOCOCCUS AUREUS

Results were discussed with the patient and her questions were
answered. The patient reported significant improvement in skin
erythema, tenderness in the size of the lump, and reports some
bruising of the left breast after aspiration. The patient should
complete antibiotic therapy and continue clinical follow-up to
evaluate for complete resolution of her symptoms. Further imaging
follow-up can be performed as clinically indicated.

Results reported by Abimelk Tiger RN on 09/29/2019.

ADDENDUM:
This addendum is to correct the recommendation for the patient's
annual screening mammography. The patient is currently 28 years old.

Recommendation: Screening mammography is recommended beginning at
age 40.

*** End of Addendum ***
Addendum:
FINDINGS: On physical exam, there is mild bruising of the medial periareolar
left breast. There is very mild residual erythema of the skin of the
left breast, and mild bruising of the periareolar medial left
breast. There is some palpable thickening in the 12 o'clock left
breast near the areola and up to 2 cm from the nipple.

Targeted ultrasound is performed, showing some residual skin
thickening in the region of the recent infection. At 12 o'clock
position 2 cm from nipple, the overall skin thickness measures up to
4.7 mm. The previously described complex fluid in the posterior
aspect of the dermis in this region has decreased in size currently
measuring 3 mm in depth (previously 6 mm in depth).

Currently, in the superficial breast parenchyma at 12 o'clock
position 2 cm from nipple is a 1.2 x 0.4 x 0.9 cm nearly anechoic
fluid collection with some mild internal floating debris.
Previously, this was a very complex appearing area that was
aspirated. On Doppler imaging, there is no peripheral hyperemia or
internal vascular flow.
IMPRESSION: Clinical and imaging improvement of the left breast infection.
Currently, there is not a complex fluid collection requiring
aspiration.

RECOMMENDATION:
Completion of antibiotic therapy and continued clinical follow-up to
evaluate for complete resolution of patient's symptoms. Further
imaging follow-up can be performed as clinically indicated.

Screening mammogram in one year.(Code:7G-6-I3Z)

I have discussed the findings and recommendations with the patient.
If applicable, a reminder letter will be sent to the patient
regarding the next appointment.

BI-RADS CATEGORY  2: Benign.

ADDENDUM:
LEFT breast aspiration yielded:

Gram Stain: ABUNDANT WBC PRESENT, PREDOMINANTLY PMN

ABUNDANT GRAM POSITIVE COCCI

Culture: MODERATE STAPHYLOCOCCUS AUREUS

NO ANAEROBES ISOLATED.   Organism ID, Bacteria STAPHYLOCOCCUS AUREUS

Results were discussed with the patient and her questions were
answered. The patient reported significant improvement in skin
erythema, tenderness in the size of the lump, and reports some
bruising of the left breast after aspiration. The patient should
complete antibiotic therapy and continue clinical follow-up to
evaluate for complete resolution of her symptoms. Further imaging
follow-up can be performed as clinically indicated.

Results reported by Abimelk Tiger RN on 09/29/2019.

*** End of Addendum ***
FINDINGS: On physical exam, there is mild bruising of the medial periareolar
left breast. There is very mild residual erythema of the skin of the
left breast, and mild bruising of the periareolar medial left
breast. There is some palpable thickening in the 12 o'clock left
breast near the areola and up to 2 cm from the nipple.

Targeted ultrasound is performed, showing some residual skin
thickening in the region of the recent infection. At 12 o'clock
position 2 cm from nipple, the overall skin thickness measures up to
4.7 mm. The previously described complex fluid in the posterior
aspect of the dermis in this region has decreased in size currently
measuring 3 mm in depth (previously 6 mm in depth).

Currently, in the superficial breast parenchyma at 12 o'clock
position 2 cm from nipple is a 1.2 x 0.4 x 0.9 cm nearly anechoic
fluid collection with some mild internal floating debris.
Previously, this was a very complex appearing area that was
aspirated. On Doppler imaging, there is no peripheral hyperemia or
internal vascular flow.
IMPRESSION: Clinical and imaging improvement of the left breast infection.
Currently, there is not a complex fluid collection requiring
aspiration.

RECOMMENDATION:
Completion of antibiotic therapy and continued clinical follow-up to
evaluate for complete resolution of patient's symptoms. Further
imaging follow-up can be performed as clinically indicated.

Screening mammogram in one year.(Code:7G-6-I3Z)

I have discussed the findings and recommendations with the patient.
If applicable, a reminder letter will be sent to the patient
regarding the next appointment.

BI-RADS CATEGORY  2: Benign.

## 2021-06-23 DIAGNOSIS — Z01419 Encounter for gynecological examination (general) (routine) without abnormal findings: Secondary | ICD-10-CM | POA: Diagnosis not present

## 2021-06-23 DIAGNOSIS — Z6823 Body mass index (BMI) 23.0-23.9, adult: Secondary | ICD-10-CM | POA: Diagnosis not present

## 2021-06-23 DIAGNOSIS — Z124 Encounter for screening for malignant neoplasm of cervix: Secondary | ICD-10-CM | POA: Diagnosis not present

## 2021-06-29 LAB — HM PAP SMEAR: HPV, high-risk: NEGATIVE

## 2021-07-10 DIAGNOSIS — L918 Other hypertrophic disorders of the skin: Secondary | ICD-10-CM | POA: Diagnosis not present

## 2021-07-10 DIAGNOSIS — L578 Other skin changes due to chronic exposure to nonionizing radiation: Secondary | ICD-10-CM | POA: Diagnosis not present

## 2021-07-10 DIAGNOSIS — D225 Melanocytic nevi of trunk: Secondary | ICD-10-CM | POA: Diagnosis not present

## 2021-07-10 DIAGNOSIS — L814 Other melanin hyperpigmentation: Secondary | ICD-10-CM | POA: Diagnosis not present

## 2021-08-26 ENCOUNTER — Ambulatory Visit (INDEPENDENT_AMBULATORY_CARE_PROVIDER_SITE_OTHER): Payer: BC Managed Care – PPO | Admitting: Sports Medicine

## 2021-08-26 ENCOUNTER — Ambulatory Visit (INDEPENDENT_AMBULATORY_CARE_PROVIDER_SITE_OTHER): Payer: BC Managed Care – PPO

## 2021-08-26 ENCOUNTER — Other Ambulatory Visit: Payer: Self-pay

## 2021-08-26 DIAGNOSIS — R1901 Right upper quadrant abdominal swelling, mass and lump: Secondary | ICD-10-CM

## 2021-08-26 DIAGNOSIS — N3289 Other specified disorders of bladder: Secondary | ICD-10-CM | POA: Diagnosis not present

## 2021-08-26 DIAGNOSIS — N281 Cyst of kidney, acquired: Secondary | ICD-10-CM

## 2021-08-26 DIAGNOSIS — N1330 Unspecified hydronephrosis: Secondary | ICD-10-CM | POA: Diagnosis not present

## 2021-08-26 MED ORDER — IOHEXOL 9 MG/ML PO SOLN: 500.0000 mL | ORAL | Status: AC

## 2021-08-26 MED ORDER — IOHEXOL 300 MG/ML  SOLN
100.0000 mL | Freq: Once | INTRAMUSCULAR | Status: AC | PRN
  Administered 2021-08-26: 17:00:00 100 mL via INTRAVENOUS

## 2021-08-26 NOTE — Progress Notes (Addendum)
° ° °  Procedures performed today:    Bedside ultrasound shows a large cystic structure associated with the inferior pole of the right kidney.  Needs CT for further delineation.  Independent interpretation of notes and tests performed by another provider:   None.  Brief History, Exam, Impression, and Recommendations:    Renal cyst, right Pleasant 31 year old female, for the past several days she is noted a enlarging mass right abdomen. On exam she has a palpable, movable mass that feels to be in the retroperitoneum. Nontender. No constitutional symptoms, no GI symptoms, no urinary symptoms. I did an ultrasound in the office and I do see a large simple appearing cyst that appears to be associated with the inferior pole of the right kidney. Due to the mass we will proceed with CT of the abdomen with IV contrast. Of note she does have a 84-year-old and a 45-month-old.  CT confirms renal macrocyst, considering associated hydronephrosis I would like an opinion from urology.    ___________________________________________ [name] Her. Dianah Field, M.D., ABFM., CAQSM. Primary Care and [place] [employer] [place] [employer] [facility] [employer] [facility]

## 2021-08-26 NOTE — Addendum Note (Signed)
Addended by: Monica Becton on: 08/26/2021 05:20 PM   Modules accepted: Orders

## 2021-08-26 NOTE — Assessment & Plan Note (Addendum)
Pleasant 31 year old female, for the past several days she is noted a enlarging mass right abdomen. On exam she has a palpable, movable mass that feels to be in the retroperitoneum. Nontender. No constitutional symptoms, no GI symptoms, no urinary symptoms. I did an ultrasound in the office and I do see a large simple appearing cyst that appears to be associated with the inferior pole of the right kidney. Due to the mass we will proceed with CT of the abdomen with IV contrast. Of note she does have a 38-year-old and a 26-month-old.  CT confirms renal macrocyst, considering associated hydronephrosis I would like an opinion from urology.

## 2021-08-27 LAB — CBC
HCT: 45.8 % — ABNORMAL HIGH (ref 35.0–45.0)
Hemoglobin: 15.3 g/dL (ref 11.7–15.5)
MCH: 31.1 pg (ref 27.0–33.0)
MCHC: 33.4 g/dL (ref 32.0–36.0)
MCV: 93.1 fL (ref 80.0–100.0)
MPV: 10.2 fL (ref 7.5–12.5)
Platelets: 246 Thousand/uL (ref 140–400)
RBC: 4.92 Million/uL (ref 3.80–5.10)
RDW: 11.6 % (ref 11.0–15.0)
WBC: 6.9 Thousand/uL (ref 3.8–10.8)

## 2021-08-27 LAB — COMPREHENSIVE METABOLIC PANEL
AG Ratio: 2.1 (calc) (ref 1.0–2.5)
ALT: 6 U/L (ref 6–29)
AST: 16 U/L (ref 10–30)
Albumin: 5.1 g/dL (ref 3.6–5.1)
Alkaline phosphatase (APISO): 52 U/L (ref 31–125)
BUN: 17 mg/dL (ref 7–25)
CO2: 31 mmol/L (ref 20–32)
Calcium: 9.8 mg/dL (ref 8.6–10.2)
Chloride: 103 mmol/L (ref 98–110)
Creat: 0.81 mg/dL (ref 0.50–0.97)
Globulin: 2.4 g/dL (calc) (ref 1.9–3.7)
Glucose, Bld: 95 mg/dL (ref 65–99)
Potassium: 4.2 mmol/L (ref 3.5–5.3)
Sodium: 140 mmol/L (ref 135–146)
Total Bilirubin: 1.3 mg/dL — ABNORMAL HIGH (ref 0.2–1.2)
Total Protein: 7.5 g/dL (ref 6.1–8.1)

## 2021-09-09 DIAGNOSIS — N281 Cyst of kidney, acquired: Secondary | ICD-10-CM | POA: Diagnosis not present

## 2021-09-22 ENCOUNTER — Other Ambulatory Visit: Payer: Self-pay | Admitting: Urology

## 2021-09-22 ENCOUNTER — Telehealth: Payer: Self-pay

## 2021-09-22 MED ORDER — FLUCONAZOLE 150 MG PO TABS: 150.0000 mg | ORAL_TABLET | Freq: Once | ORAL | 0 refills | Status: AC

## 2021-09-22 NOTE — Telephone Encounter (Signed)
Diflucan sent to pharmacy on file.   ___________________________________________ Clearnce Sorrel, DNP, APRN, FNP-BC Primary Care and [place]

## 2021-09-22 NOTE — Telephone Encounter (Signed)
Patient feels like she has a yeast infection and would like something called in. Please advise.

## 2021-09-23 NOTE — Telephone Encounter (Signed)
Patient aware Rx sent to pharmacy. °

## 2021-09-25 NOTE — Progress Notes (Addendum)
COVID swab appointment: N/A  COVID Vaccine Completed:  No Date COVID Vaccine completed: Has received booster: COVID vaccine manufacturer: Pfizer    [facility] [employer]   Date of COVID positive in last 13 days:No  PCP - Christen Butter, NP Cardiologist - N/A  Chest x-ray -  N/A EKG -  N/A Stress Test -  N/A ECHO -  N/A Cardiac Cath -  N/A Pacemaker/ICD device last checked: Spinal Cord Stimulator:  Bowel Prep -  N/A  Sleep Study -  N/A CPAP -   Fasting Blood Sugar -  N/A Checks Blood Sugar _____ times a day  Blood Thinner Instructions: N/A Aspirin Instructions: Last Dose:  Activity level:   Can go up a flight of stairs and perform activities of daily living without stopping and without symptoms of chest pain or shortness of breath.  Able to exercise without symptoms  Anesthesia review:  N/A  Patient denies shortness of breath, fever, cough and chest pain at PAT appointment   Patient verbalized understanding of instructions that were given to them at the PAT appointment. Patient was also instructed that they will need to review over the PAT instructions again at home before surgery.

## 2021-09-25 NOTE — Patient Instructions (Addendum)
DUE TO COVID-19 ONLY ONE VISITOR IS ALLOWED TO COME WITH YOU AND STAY IN THE WAITING ROOM ONLY DURING PRE OP AND PROCEDURE.   **NO VISITORS ARE ALLOWED IN THE SHORT STAY AREA OR RECOVERY ROOM!!**   You are not required to quarantine, however you are required to wear a well-fitted mask when you are out and around people not in your household.  Hand Hygiene often Do NOT share personal items Notify your provider if you are in close contact with someone who has COVID or you develop fever 100.4 or greater, new onset of sneezing, cough, sore throat, shortness of breath or body aches.        Your procedure is scheduled on: Wednesday, 10-08-21   Report to [facility] Main Entrance    Report to admitting at 6:15 AM   Call this number if you have problems the morning of surgery [phone]   Do not eat food :After Midnight.   After Midnight you may have the following liquids until  5:30 AM DAY OF SURGERY  Water Black Coffee (sugar ok, NO MILK/CREAM OR CREAMERS)  Tea (sugar ok, NO MILK/CREAM OR CREAMERS) regular and decaf                             Plain Jell-O (NO RED)                                           Fruit ices (not with fruit pulp, NO RED)                                     Popsicles (NO RED)                                                                  Juice: apple, WHITE grape, WHITE cranberry Sports drinks like Gatorade (NO RED) Clear broth(vegetable,chicken,beef)               FOLLOW BOWEL PREP AND ANY ADDITIONAL PRE OP INSTRUCTIONS YOU RECEIVED FROM YOUR SURGEON'S OFFICE!!!     Oral Hygiene is also important to reduce your risk of infection.                                    Remember - BRUSH YOUR TEETH THE MORNING OF SURGERY WITH YOUR REGULAR TOOTHPASTE   Do NOT smoke after Midnight   Take these medicines the morning of surgery with A SIP OF WATER: Zyrtec.  Okay to use Albuterol inhalers   Stop all vitamins and herbal supplements a week before surgery.      Stop Motrin, Aleve, Ibuprofen a week before surgery.                              You may not have any metal on your body including hair pins, jewelry, and body piercing  Do not wear make-up, lotions, powders, perfumes or deodorant  Do not wear nail polish including gel and S&S, artificial/acrylic nails, or any other type of covering on natural nails including finger and toenails. If you have artificial nails, gel coating, etc. that needs to be removed by a nail salon please have this removed prior to surgery or surgery may need to be canceled/ delayed if the surgeon/ anesthesia feels like they are unable to be safely monitored.   Do not shave  48 hours prior to surgery.      Contacts, dentures or bridgework may not be worn into surgery.   Bring small overnight bag day of surgery.   Do not bring valuables to the hospital. [facility] IS NOT RESPONSIBLE   FOR VALUABLES.    Patients discharged on the day of surgery will not be allowed to drive home.  Someone NEEDS to stay with you for the first 24 hours after anesthesia.  Special Instructions: Bring a copy of your healthcare power of attorney and living will documents the day of surgery if you haven't scanned them before.   Please read over the following fact sheets you were given: IF YOU HAVE QUESTIONS ABOUT YOUR PRE-OP INSTRUCTIONS PLEASE CALL [phone] [facility]   Veronica Figueroa - Preparing for Surgery Before surgery, you can play an important role.  Because skin is not sterile, your skin needs to be as free of germs as possible.  You can reduce the number of germs on your skin by washing with CHG (chlorahexidine gluconate) soap before surgery.  CHG is an antiseptic cleaner which kills germs and bonds with the skin to continue killing germs even after washing. Please DO NOT use if you have an allergy to CHG or antibacterial soaps.  If your skin becomes reddened/irritated stop using the CHG and inform your nurse when you arrive at  Short Stay. Do not shave (including legs and underarms) for at least 48 hours prior to the first CHG shower.  You may shave your face/neck.  Please follow these instructions carefully:  1.  Shower with CHG Soap the night before surgery and the  morning of surgery.  2.  If you choose to wash your hair, wash your hair first as usual with your normal  shampoo.  3.  After you shampoo, rinse your hair and body thoroughly to remove the shampoo.                             4.  Use CHG as you would any other liquid soap.  You can apply chg directly to the skin and wash.  Gently with a scrungie or clean washcloth.  5.  Apply the CHG Soap to your body ONLY FROM THE NECK DOWN.   Do   not use on face/ open                           Wound or open sores. Avoid contact with eyes, ears mouth and   genitals (private parts).                       Wash face,  Genitals (private parts) with your normal soap.             6.  Wash thoroughly, paying special attention to the area where your    surgery  will be performed.  7.  Thoroughly rinse  your body with warm water from the neck down.  8.  DO NOT shower/wash with your normal soap after using and rinsing off the CHG Soap.                9.  Pat yourself dry with a clean towel.            10.  Wear clean pajamas.            11.  Place clean sheets on your bed the night of your first shower and do not  sleep with pets. Day of Surgery : Do not apply any lotions/deodorants the morning of surgery.  Please wear clean clothes to the hospital/surgery center.  FAILURE TO FOLLOW THESE INSTRUCTIONS MAY RESULT IN THE CANCELLATION OF YOUR SURGERY  PATIENT SIGNATURE_________________________________  NURSE SIGNATURE__________________________________  ________________________________________________________________________  WHAT IS A BLOOD TRANSFUSION? Blood Transfusion Information  A transfusion is the replacement of blood or some of its parts. Blood is made up of multiple  cells which provide different functions. Red blood cells carry oxygen and are used for blood loss replacement. White blood cells fight against infection. Platelets control bleeding. Plasma helps clot blood. Other blood products are available for specialized needs, such as hemophilia or other clotting disorders. BEFORE THE TRANSFUSION  Who gives blood for transfusions?  Healthy volunteers who are fully evaluated to make sure their blood is safe. This is blood bank blood. Transfusion therapy is the safest it has ever been in the practice of medicine. Before blood is taken from a donor, a complete history is taken to make sure that person has no history of diseases nor engages in risky social behavior (examples are intravenous drug use or sexual activity with multiple partners). The donor's travel history is screened to minimize risk of transmitting infections, such as malaria. The donated blood is tested for signs of infectious diseases, such as HIV and hepatitis. The blood is then tested to be sure it is compatible with you in order to minimize the chance of a transfusion reaction. If you or a relative donates blood, this is often done in anticipation of surgery and is not appropriate for emergency situations. It takes many days to process the donated blood. RISKS AND COMPLICATIONS Although transfusion therapy is very safe and saves many lives, the main dangers of transfusion include:  Getting an infectious disease. Developing a transfusion reaction. This is an allergic reaction to something in the blood you were given. Every precaution is taken to prevent this. The decision to have a blood transfusion has been considered carefully by your caregiver before blood is given. Blood is not given unless the benefits outweigh the risks. AFTER THE TRANSFUSION Right after receiving a blood transfusion, you will usually feel much better and more energetic. This is especially true if your red blood cells have  gotten low (anemic). The transfusion raises the level of the red blood cells which carry oxygen, and this usually causes an energy increase. The nurse administering the transfusion will monitor you carefully for complications. HOME CARE INSTRUCTIONS  No special instructions are needed after a transfusion. You may find your energy is better. Speak with your caregiver about any limitations on activity for underlying diseases you may have. SEEK MEDICAL CARE IF:  Your condition is not improving after your transfusion. You develop redness or irritation at the intravenous (IV) site. SEEK IMMEDIATE MEDICAL CARE IF:  Any of the following symptoms occur over the next 12 hours: Shaking chills. You have a  temperature by mouth above 102 F (38.9 C), not controlled by medicine. Chest, back, or muscle pain. People around you feel you are not acting correctly or are confused. Shortness of breath or difficulty breathing. Dizziness and fainting. You get a rash or develop hives. You have a decrease in urine output. Your urine turns a dark color or changes to pink, red, or brown. Any of the following symptoms occur over the next 10 days: You have a temperature by mouth above 102 F (38.9 C), not controlled by medicine. Shortness of breath. Weakness after normal activity. The white part of the eye turns yellow (jaundice). You have a decrease in the amount of urine or are urinating less often. Your urine turns a dark color or changes to pink, red, or brown. Document Released: 07/17/2000 Document Revised: 10/12/2011 Document Reviewed: 03/05/2008 [facility] Patient Information 2014 [place], [place].  _______________________________________________________________________

## 2021-09-26 ENCOUNTER — Encounter (HOSPITAL_COMMUNITY): Payer: Self-pay

## 2021-09-26 ENCOUNTER — Other Ambulatory Visit: Payer: Self-pay

## 2021-09-26 ENCOUNTER — Encounter (HOSPITAL_COMMUNITY)
Admission: RE | Admit: 2021-09-26 | Discharge: 2021-09-26 | Disposition: A | Discharge status: Home / Self Care | Payer: BC Managed Care – PPO | Source: Ambulatory Visit | Attending: Urology | Admitting: Urology

## 2021-09-26 VITALS — BP 113/64 | HR 73 | Temp 98.7°F | Resp 14 | Ht 67.0 in | Wt 134.0 lb

## 2021-09-26 DIAGNOSIS — Z01818 Encounter for other preprocedural examination: Secondary | ICD-10-CM

## 2021-09-26 DIAGNOSIS — Z01812 Encounter for preprocedural laboratory examination: Secondary | ICD-10-CM | POA: Diagnosis not present

## 2021-09-26 HISTORY — DX: Thyrotoxicosis, unspecified without thyrotoxic crisis or storm: E05.90

## 2021-09-26 LAB — CBC
HCT: 43.2 % (ref 36.0–46.0)
Hemoglobin: 14.3 g/dL (ref 12.0–15.0)
MCH: 31 pg (ref 26.0–34.0)
MCHC: 33.1 g/dL (ref 30.0–36.0)
MCV: 93.5 fL (ref 80.0–100.0)
Platelets: 217 10*3/uL (ref 150–400)
RBC: 4.62 MIL/uL (ref 3.87–5.11)
RDW: 12 % (ref 11.5–15.5)
WBC: 5.9 10*3/uL (ref 4.0–10.5)
nRBC: 0 % (ref 0.0–0.2)

## 2021-09-26 LAB — BASIC METABOLIC PANEL
Anion gap: 7 (ref 5–15)
BUN: 15 mg/dL (ref 6–20)
CO2: 26 mmol/L (ref 22–32)
Calcium: 9 mg/dL (ref 8.9–10.3)
Chloride: 105 mmol/L (ref 98–111)
Creatinine, Ser: 0.81 mg/dL (ref 0.44–1.00)
GFR, Estimated: >60 mL/min (ref >60)
Glucose, Bld: 75 mg/dL (ref 70–99)
Potassium: 3.7 mmol/L (ref 3.5–5.1)
Sodium: 138 mmol/L (ref 135–145)

## 2021-09-27 LAB — TYPE AND SCREEN
ABO/RH(D): A POS
Antibody Screen: NEGATIVE

## 2021-10-07 ENCOUNTER — Encounter (HOSPITAL_COMMUNITY): Payer: Self-pay | Admitting: Urology

## 2021-10-08 ENCOUNTER — Ambulatory Visit (HOSPITAL_COMMUNITY)
Admission: RE | Admit: 2021-10-08 | Discharge: 2021-10-08 | Disposition: A | Discharge status: Home / Self Care | Payer: BC Managed Care – PPO | Attending: Urology | Admitting: Urology

## 2021-10-08 ENCOUNTER — Other Ambulatory Visit: Payer: Self-pay

## 2021-10-08 ENCOUNTER — Encounter (HOSPITAL_COMMUNITY)
Admission: RE | Disposition: A | Discharge status: Home / Self Care | Payer: Self-pay | Source: Home / Self Care | Attending: Urology

## 2021-10-08 ENCOUNTER — Ambulatory Visit (HOSPITAL_COMMUNITY): Payer: BC Managed Care – PPO | Admitting: Anesthesiology

## 2021-10-08 ENCOUNTER — Encounter (HOSPITAL_COMMUNITY): Payer: Self-pay | Admitting: Urology

## 2021-10-08 DIAGNOSIS — N281 Cyst of kidney, acquired: Secondary | ICD-10-CM | POA: Diagnosis not present

## 2021-10-08 DIAGNOSIS — Z01818 Encounter for other preprocedural examination: Secondary | ICD-10-CM

## 2021-10-08 HISTORY — PX: LAPAROSCOPIC PARTIAL NEPHRECTOMY: SHX5910

## 2021-10-08 LAB — PREGNANCY, URINE: Preg Test, Ur: NEGATIVE

## 2021-10-08 SURGERY — LAPAROSCOPIC PARTIAL NEPHRECTOMY
Anesthesia: General | Site: Flank | Laterality: Right

## 2021-10-08 MED ORDER — PHENYLEPHRINE 40 MCG/ML (10ML) SYRINGE FOR IV PUSH (FOR BLOOD PRESSURE SUPPORT)
PREFILLED_SYRINGE | INTRAVENOUS | Status: AC
  Filled 2021-10-08: qty 10

## 2021-10-08 MED ORDER — KETOROLAC TROMETHAMINE 30 MG/ML IJ SOLN
INTRAMUSCULAR | Status: AC
  Administered 2021-10-08: 30 mg via INTRAVENOUS
  Filled 2021-10-08: qty 1

## 2021-10-08 MED ORDER — FENTANYL CITRATE PF 50 MCG/ML IJ SOSY
PREFILLED_SYRINGE | INTRAMUSCULAR | Status: AC
  Administered 2021-10-08: 50 ug via INTRAVENOUS
  Filled 2021-10-08: qty 2

## 2021-10-08 MED ORDER — SODIUM CHLORIDE (PF) 0.9 % IJ SOLN
INTRAMUSCULAR | Status: AC
  Filled 2021-10-08: qty 100

## 2021-10-08 MED ORDER — FENTANYL CITRATE (PF) 250 MCG/5ML IJ SOLN
INTRAMUSCULAR | Status: AC
  Filled 2021-10-08: qty 5

## 2021-10-08 MED ORDER — ROCURONIUM BROMIDE 10 MG/ML (PF) SYRINGE
PREFILLED_SYRINGE | INTRAVENOUS | Status: AC
  Filled 2021-10-08: qty 10

## 2021-10-08 MED ORDER — DEXAMETHASONE SODIUM PHOSPHATE 10 MG/ML IJ SOLN
INTRAMUSCULAR | Status: DC | PRN
Start: 2021-10-08 — End: 2021-10-08
  Administered 2021-10-08: 10 mg via INTRAVENOUS

## 2021-10-08 MED ORDER — FENTANYL CITRATE PF 50 MCG/ML IJ SOSY
25.0000 ug | PREFILLED_SYRINGE | INTRAMUSCULAR | Status: DC | PRN
  Administered 2021-10-08: 50 ug via INTRAVENOUS

## 2021-10-08 MED ORDER — ACETAMINOPHEN 325 MG PO TABS: 325.0000 mg | ORAL_TABLET | ORAL | Status: DC | PRN

## 2021-10-08 MED ORDER — ONDANSETRON HCL 4 MG/2ML IJ SOLN
INTRAMUSCULAR | Status: DC | PRN
  Administered 2021-10-08: 4 mg via INTRAVENOUS

## 2021-10-08 MED ORDER — KETAMINE HCL 50 MG/5ML IJ SOSY
PREFILLED_SYRINGE | INTRAMUSCULAR | Status: AC
  Filled 2021-10-08: qty 5

## 2021-10-08 MED ORDER — ORAL CARE MOUTH RINSE: 15.0000 mL | Freq: Once | OROMUCOSAL | Status: AC

## 2021-10-08 MED ORDER — MIDAZOLAM HCL 5 MG/5ML IJ SOLN
INTRAMUSCULAR | Status: DC | PRN
Start: 2021-10-08 — End: 2021-10-08
  Administered 2021-10-08: 2 mg via INTRAVENOUS

## 2021-10-08 MED ORDER — MIDAZOLAM HCL 2 MG/2ML IJ SOLN
INTRAMUSCULAR | Status: AC
  Filled 2021-10-08: qty 2

## 2021-10-08 MED ORDER — ACETAMINOPHEN 10 MG/ML IV SOLN: 1000.0000 mg | Freq: Once | INTRAVENOUS | Status: DC | PRN

## 2021-10-08 MED ORDER — CHLORHEXIDINE GLUCONATE 0.12 % MT SOLN
15.0000 mL | Freq: Once | OROMUCOSAL | Status: AC
  Administered 2021-10-08: 15 mL via OROMUCOSAL

## 2021-10-08 MED ORDER — PROPOFOL 10 MG/ML IV BOLUS
INTRAVENOUS | Status: AC
  Filled 2021-10-08: qty 20

## 2021-10-08 MED ORDER — AMISULPRIDE (ANTIEMETIC) 5 MG/2ML IV SOLN: 10.0000 mg | Freq: Once | INTRAVENOUS | Status: DC | PRN

## 2021-10-08 MED ORDER — KETOROLAC TROMETHAMINE 30 MG/ML IJ SOLN: 30.0000 mg | Freq: Once | INTRAMUSCULAR | Status: AC

## 2021-10-08 MED ORDER — PROPOFOL 10 MG/ML IV BOLUS
INTRAVENOUS | Status: DC | PRN
  Administered 2021-10-08: 150 mg via INTRAVENOUS

## 2021-10-08 MED ORDER — BUPIVACAINE-EPINEPHRINE (PF) 0.5% -1:200000 IJ SOLN
INTRAMUSCULAR | Status: AC
  Filled 2021-10-08: qty 30

## 2021-10-08 MED ORDER — ACETAMINOPHEN 10 MG/ML IV SOLN
INTRAVENOUS | Status: AC
  Filled 2021-10-08: qty 100

## 2021-10-08 MED ORDER — ACETAMINOPHEN 10 MG/ML IV SOLN
INTRAVENOUS | Status: DC | PRN
  Administered 2021-10-08: 1000 mg via INTRAVENOUS

## 2021-10-08 MED ORDER — BUPIVACAINE LIPOSOME 1.3 % IJ SUSP
INTRAMUSCULAR | Status: DC | PRN
  Administered 2021-10-08: 20 mL

## 2021-10-08 MED ORDER — FENTANYL CITRATE (PF) 100 MCG/2ML IJ SOLN
INTRAMUSCULAR | Status: DC | PRN
Start: 2021-10-08 — End: 2021-10-08
  Administered 2021-10-08: 50 ug via INTRAVENOUS
  Administered 2021-10-08: 150 ug via INTRAVENOUS

## 2021-10-08 MED ORDER — CEFAZOLIN SODIUM-DEXTROSE 2-4 GM/100ML-% IV SOLN
2.0000 g | INTRAVENOUS | Status: AC
  Administered 2021-10-08: 2 g via INTRAVENOUS
  Filled 2021-10-08: qty 100

## 2021-10-08 MED ORDER — ROCURONIUM BROMIDE 10 MG/ML (PF) SYRINGE
PREFILLED_SYRINGE | INTRAVENOUS | Status: DC | PRN
  Administered 2021-10-08: 30 mg via INTRAVENOUS
  Administered 2021-10-08: 50 mg via INTRAVENOUS
  Administered 2021-10-08: 20 mg via INTRAVENOUS

## 2021-10-08 MED ORDER — KETAMINE HCL-SODIUM CHLORIDE 100-0.9 MG/10ML-% IV SOSY
PREFILLED_SYRINGE | INTRAVENOUS | Status: DC | PRN
  Administered 2021-10-08: 10 mg via INTRAVENOUS
  Administered 2021-10-08: 20 mg via INTRAVENOUS

## 2021-10-08 MED ORDER — EPHEDRINE 5 MG/ML INJ
INTRAVENOUS | Status: AC
  Filled 2021-10-08: qty 5

## 2021-10-08 MED ORDER — SUGAMMADEX SODIUM 200 MG/2ML IV SOLN
INTRAVENOUS | Status: DC | PRN
Start: 2021-10-08 — End: 2021-10-08
  Administered 2021-10-08: 200 mg via INTRAVENOUS

## 2021-10-08 MED ORDER — SCOPOLAMINE 1 MG/3DAYS TD PT72
MEDICATED_PATCH | TRANSDERMAL | Status: AC
  Filled 2021-10-08: qty 1

## 2021-10-08 MED ORDER — PHENYLEPHRINE HCL-NACL 20-0.9 MG/250ML-% IV SOLN
INTRAVENOUS | Status: DC | PRN
  Administered 2021-10-08: 40 ug/min via INTRAVENOUS

## 2021-10-08 MED ORDER — LACTATED RINGERS IV SOLN: INTRAVENOUS | Status: DC

## 2021-10-08 MED ORDER — LACTATED RINGERS IV SOLN: INTRAVENOUS | Status: DC | PRN

## 2021-10-08 MED ORDER — SCOPOLAMINE 1 MG/3DAYS TD PT72
1.0000 | MEDICATED_PATCH | TRANSDERMAL | Status: DC
  Administered 2021-10-08: 1 via TRANSDERMAL

## 2021-10-08 MED ORDER — BUPIVACAINE-EPINEPHRINE 0.5% -1:200000 IJ SOLN
INTRAMUSCULAR | Status: DC | PRN
  Administered 2021-10-08: 7 mL
  Administered 2021-10-08: 23 mL

## 2021-10-08 MED ORDER — LIDOCAINE 2% (20 MG/ML) 5 ML SYRINGE
INTRAMUSCULAR | Status: DC | PRN
  Administered 2021-10-08: 80 mg via INTRAVENOUS

## 2021-10-08 MED ORDER — BUPIVACAINE LIPOSOME 1.3 % IJ SUSP
INTRAMUSCULAR | Status: AC
  Filled 2021-10-08: qty 20

## 2021-10-08 MED ORDER — ACETAMINOPHEN 160 MG/5ML PO SOLN: 325.0000 mg | ORAL | Status: DC | PRN

## 2021-10-08 SURGICAL SUPPLY — 65 items
APPLICATOR ARISTA FLEXITIP XL (MISCELLANEOUS) IMPLANT
APPLICATOR SURGIFLO ENDO (HEMOSTASIS) IMPLANT
APPLIER CLIP ROT 10 11.4 M/L (STAPLE)
BAG COUNTER SPONGE SURGICOUNT (BAG) ×1 IMPLANT
BAG LAPAROSCOPIC 12 15 PORT 16 (BASKET) IMPLANT
BAG RETRIEVAL 12/15 (BASKET)
BAG ZIPLOCK 12X15 (MISCELLANEOUS) ×1 IMPLANT
BLADE EXTENDED COATED 6.5IN (ELECTRODE) IMPLANT
BLADE SURG SZ10 CARB STEEL (BLADE) IMPLANT
CHLORAPREP W/TINT 26 (MISCELLANEOUS) ×2 IMPLANT
CLIP APPLIE ROT 10 11.4 M/L (STAPLE) IMPLANT
CLIP LIGATING HEM O LOK PURPLE (MISCELLANEOUS) ×2 IMPLANT
CLIP LIGATING HEMO LOK XL GOLD (MISCELLANEOUS) ×2 IMPLANT
CLIP LIGATING HEMO O LOK GREEN (MISCELLANEOUS) IMPLANT
CUTTER FLEX LINEAR 45M (STAPLE) IMPLANT
DERMABOND ADVANCED (GAUZE/BANDAGES/DRESSINGS) ×1
DERMABOND ADVANCED .7 DNX12 (GAUZE/BANDAGES/DRESSINGS) ×1 IMPLANT
DRAPE INCISE IOBAN 66X45 STRL (DRAPES) ×2 IMPLANT
DRAPE WARM FLUID 44X44 (DRAPES) IMPLANT
ELECT PENCIL ROCKER SW 15FT (MISCELLANEOUS) ×2 IMPLANT
ELECT REM PT RETURN 15FT ADLT (MISCELLANEOUS) ×2 IMPLANT
GLOVE SURG ENC TEXT LTX SZ7.5 (GLOVE) ×2 IMPLANT
GLOVE SURG SYN 7.0 (GLOVE) ×2 IMPLANT
GLOVE SURG SYN 7.0 PF PI (GLOVE) IMPLANT
GLOVE SURG UNDER POLY LF SZ7 (GLOVE) ×1 IMPLANT
GLOVE SURG UNDER POLY LF SZ7.5 (GLOVE) ×1 IMPLANT
GOWN STRL REUS W/TWL LRG LVL3 (GOWN DISPOSABLE) ×5 IMPLANT
HEMOSTAT ARISTA ABSORB 3G PWDR (HEMOSTASIS) IMPLANT
HEMOSTAT SURGICEL 4X8 (HEMOSTASIS) IMPLANT
IRRIG SUCT STRYKERFLOW 2 WTIP (MISCELLANEOUS) ×2
IRRIGATION SUCT STRKRFLW 2 WTP (MISCELLANEOUS) ×1 IMPLANT
KIT BASIN OR (CUSTOM PROCEDURE TRAY) ×2 IMPLANT
KIT TURNOVER KIT A (KITS) IMPLANT
MANIFOLD NEPTUNE II (INSTRUMENTS) ×2 IMPLANT
MARKER SKIN DUAL TIP RULER LAB (MISCELLANEOUS) ×2 IMPLANT
NDL INSUFFLATION 14GA 120MM (NEEDLE) IMPLANT
NDL SPNL 22GX3.5 QUINCKE BK (NEEDLE) IMPLANT
NEEDLE INSUFFLATION 14GA 120MM (NEEDLE) ×2 IMPLANT
NEEDLE SPNL 22GX3.5 QUINCKE BK (NEEDLE) IMPLANT
PAD POSITIONING PINK XL (MISCELLANEOUS) ×2 IMPLANT
PROTECTOR NERVE ULNAR (MISCELLANEOUS) ×4 IMPLANT
RELOAD 45 VASCULAR/THIN (ENDOMECHANICALS) IMPLANT
RELOAD STAPLE 45 2.5 WHT GRN (ENDOMECHANICALS) IMPLANT
RELOAD STAPLE 45 3.5 BLU ETS (ENDOMECHANICALS) IMPLANT
RELOAD STAPLE TA45 3.5 REG BLU (ENDOMECHANICALS) IMPLANT
SCISSORS LAP 5X35 DISP (ENDOMECHANICALS) IMPLANT
SET TUBE SMOKE EVAC HIGH FLOW (TUBING) ×2 IMPLANT
SHEARS HARMONIC ACE PLUS 36CM (ENDOMECHANICALS) ×2 IMPLANT
SLEEVE XCEL OPT CAN 5 100 (ENDOMECHANICALS) ×3 IMPLANT
SPIKE FLUID TRANSFER (MISCELLANEOUS) ×2 IMPLANT
SPONGE T-LAP 4X18 ~~LOC~~+RFID (SPONGE) ×1 IMPLANT
SUT MNCRL AB 4-0 PS2 18 (SUTURE) ×4 IMPLANT
SUT PDS AB 0 CT1 36 (SUTURE) ×2 IMPLANT
SUT VIC AB 2-0 CT1 27 (SUTURE)
SUT VIC AB 2-0 CT1 27XBRD (SUTURE) IMPLANT
SUT VICRYL 0 UR6 27IN ABS (SUTURE) ×2 IMPLANT
TAPE CLOTH 4X10 WHT NS (GAUZE/BANDAGES/DRESSINGS) IMPLANT
TOWEL OR 17X26 10 PK STRL BLUE (TOWEL DISPOSABLE) ×2 IMPLANT
TOWEL OR NON WOVEN STRL DISP B (DISPOSABLE) ×2 IMPLANT
TRAY FOLEY MTR SLVR 14FR STAT (SET/KITS/TRAYS/PACK) ×1 IMPLANT
TRAY FOLEY MTR SLVR 16FR STAT (SET/KITS/TRAYS/PACK) ×1 IMPLANT
TRAY LAPAROSCOPIC (CUSTOM PROCEDURE TRAY) ×2 IMPLANT
TROCAR BLADELESS OPT 5 100 (ENDOMECHANICALS) ×2 IMPLANT
TROCAR UNIVERSAL OPT 12M 100M (ENDOMECHANICALS) ×1 IMPLANT
TROCAR XCEL 12X100 BLDLESS (ENDOMECHANICALS) ×1 IMPLANT

## 2021-10-08 NOTE — H&P (Signed)
31 year old female presents today for further discussion of a right renal cyst.  ? ?The patient was lying on her left side recently and felt a bulge in her right abdomen. She subsequently had a CT scan that demonstrated a large right lower pole simple cyst. She also had several cysts on her other kidney 2. She is here for further discussion. The patient states that she has some intermittent abdominal pain on the right. However, it is tolerable and not a big deal for her.  ? ?The patient recently delivered a baby boy. This is her second child. She is planning on having 1 additional child in the near future.  ? ?The patient is otherwise healthy with no significant past medical history.  ? ?  ?ALLERGIES: None  ? ?MEDICATIONS: Fish Oil  ?Prenatal Vitamin  ?Sunflower Lecithin  ?Vitamin D  ?  ? ?GU PSH: No GU PSH   ?   ?PSH Notes: Tonsillectomy (2005), wisdom teeth extraction (2010)  ? ?NON-GU PSH: No Non-GU PSH   ? ?GU PMH: None  ? ?NON-GU PMH: Asthma ?Hyperthyroidism ?  ? ?FAMILY HISTORY: 1 Daughter - Daughter ?1 son - Son ?nephrolithiasis - Father  ? ?SOCIAL HISTORY: Marital Status: Married ?Preferred Language: [place]; Ethnicity: Not Hispanic Or Latino; Race: White ?Current Smoking Status: Patient has never smoked.  ? ?Tobacco Use Assessment Completed: Used Tobacco in last 30 days? ?Does drink.  ?Drinks 2 caffeinated drinks per day. ?Patient's occupation Museum/gallery exhibitions officer. ?  ? ?REVIEW OF SYSTEMS:    ?GU Review Female:   Patient denies frequent urination, hard to postpone urination, burning /pain with urination, get up at night to urinate, leakage of urine, stream starts and stops, trouble starting your stream, have to strain to urinate, and being pregnant.  ?Gastrointestinal (Upper):   Patient denies nausea, vomiting, and indigestion/ heartburn.  ?Gastrointestinal (Lower):   Patient denies diarrhea and constipation.  ?Constitutional:   Patient reports fatigue. Patient denies fever, night sweats, and weight loss.   ?Skin:   Patient denies skin rash/ lesion and itching.  ?Eyes:   Patient denies blurred vision and double vision.  ?Ears/ Nose/ Throat:   Patient reports sinus problems. Patient denies sore throat.  ?Hematologic/Lymphatic:   Patient denies swollen glands and easy bruising.  ?Cardiovascular:   Patient denies chest pains and leg swelling.  ?Respiratory:   Patient denies cough and shortness of breath.  ?Endocrine:   Patient denies excessive thirst.  ?Musculoskeletal:   Patient denies back pain and joint pain.  ?Neurological:   Patient denies headaches and dizziness.  ?Psychologic:   Patient denies depression and anxiety.  ? ?Notes: Abdominal pain intermittently  ?  ? ?VITAL SIGNS:    ?  09/09/2021 02:34 PM  ?Weight 142 lb / 64.41 kg  ?Height 66 in / 167.64 cm  ?BMI 22.9 kg/m?  ? ?MULTI-SYSTEM PHYSICAL EXAMINATION:    ?Constitutional: Well-nourished. No physical deformities. Normally developed. Good grooming.  ?Neck: Neck symmetrical, not swollen. Normal tracheal position.  ?Respiratory: No labored breathing, no use of accessory muscles.   ?Cardiovascular: Normal temperature, normal extremity pulses, no swelling, no varicosities.  ?Lymphatic: No enlargement of neck, axillae, groin.  ?Skin: No paleness, no jaundice, no cyanosis. No lesion, no ulcer, no rash.  ?Neurologic / Psychiatric: Oriented to time, oriented to place, oriented to person. No depression, no anxiety, no agitation.  ?Gastrointestinal: No mass, no tenderness, no rigidity, non obese abdomen.  ?Eyes: Normal conjunctivae. Normal eyelids.  ?Ears, Nose, Mouth, and Throat: Left ear no scars,  no lesions, no masses. Right ear no scars, no lesions, no masses. Nose no scars, no lesions, no masses. Normal hearing. Normal lips.  ?Musculoskeletal: Normal gait and station of head and neck.  ? ?  ?Complexity of Data:  ?Source Of History:  Patient  ?Records Review:   Previous Doctor Records, Previous Hospital Records, Previous Patient Records, POC Tool  ?Urine Test  Review:   Urinalysis  ?X-Ray Review: C.T. Abdomen/Pelvis: Reviewed Films. Discussed With Patient.  ?  ? ?PROCEDURES:    ? ?     Urinalysis ?Dipstick Dipstick Cont'd  ?Color: Straw Bilirubin: Neg mg/dL  ?Appearance: Clear Ketones: Neg mg/dL  ?Specific Gravity: 1.010 Blood: Neg ery/uL  ?pH: 6.0 Protein: Neg mg/dL  ?Glucose: Neg mg/dL Urobilinogen: 0.2 mg/dL  ?  Nitrites: Neg  ?  Leukocyte Esterase: Neg leu/uL  ? ? ?ASSESSMENT:  ?    ICD-10 Details  ?1 GU:   Renal cyst - N28.1   ? ?PLAN:    ? ?      Orders ? ? ?      Schedule ?X-Rays: 6 Months - Renal Ultrasound  ?Return Visit/Planned Activity: 6 Months - Office Visit  ? ? ?      Document ?Letter(s):  Created for Patient: Clinical Summary  ? ? ?     Notes:   I reviewed the patient's CT scan with her. We discussed the natural history of simple cyst. I told her this is a benign process with no malignant potential. I do think however that over time she will need that cyst on the right taking care of given that it is already 9 cm and she is only 31 years old. We discussed what this would look like. We discussed the potential of aspirating it ahead of time to make sure that that was the etiology of her symptoms and then taking care of it in the future if it recurred. The patient would like to follow-up with me in 6 months with a renal ultrasound prior.  ?  ? ?

## 2021-10-08 NOTE — Interval H&P Note (Signed)
History and Physical Interval Note: ? ?10/08/2021 ?8:22 AM ? ?Veronica Figueroa  has presented today for surgery, with the diagnosis of RIGHT RENAL CYST.  The various methods of treatment have been discussed with the patient and family. After consideration of risks, benefits and other options for treatment, the patient has consented to  Procedure(s) with comments: ?LAPAROSCOPIC RIGHT RENAL CYST MARSUPILIZATION (Right) - 2 HRS FOR THIS CASE as a surgical intervention.  The patient's history has been reviewed, patient examined, no change in status, stable for surgery.  I have reviewed the patient's chart and labs.  Questions were answered to the patient's satisfaction.   ? ? ?Crist Fat ? ? ?

## 2021-10-08 NOTE — Op Note (Signed)
Preoperative diagnosis:  ?Right renal cyst ? ?Postoperative diagnosis:  ?same  ? ?Procedure: ?Laparoscopic right renal cyst marsupialization ?  ?Surgeon: Crist Fat, MD ?Resident Assistant: Will Jamaica, MD ? ?Anesthesia: General ? ?Complications: None ? ?Intraoperative findings:  ?#1: large lower pole right renal cyst ? ?EBL: minimal ? ?Specimens:  Right renal cyst  capsule ? ?Indication: Veronica Figueroa is a 31 y.o. patient with large right lower pole renal cyst.  After reviewing the management options for treatment, she elected to proceed with the above surgical procedure(s). We have discussed the potential benefits and risks of the procedure, side effects of the proposed treatment, the likelihood of the patient achieving the goals of the procedure, and any potential problems that might occur during the procedure or recuperation. Informed consent has been obtained. ? ?Description of procedure:  ?A site was selected lateral to the umbilicus for placement of the camera port. This was placed using a standard open Hassan technique which allowed entry into the peritoneal cavity under direct vision and without difficulty. A 5 mm Hassan cannula was placed in the umbilicus and a pneumoperitoneum established. The camera was then used to inspect the abdomen and there was no evidence of any intra-abdominal injuries or other abnormalities. The remaining abdominal ports were then placed under visual guidance.  A second  5 mm port was placed in the right lower quadrant approximately 8 cm away from the camera. A 5 mm port was placed in the right upper quadrant again 8 cm away from the camera.  All ports were placed under direct vision without difficulty. ?  ?The white line of Toldt was incised allowing the colon to be mobilized medially and the plane between the mesocolon and the anterior layer of Gerota?s fascia to be developed and the kidney exposed. The cyst was easily identified and entered once it had been isolated.   The cyst was drained and then the capsule to the level of the renal parenchyma was incised.  There was minimal bleeding.  We then performed a TAPs block along the right anterior axillary line in 3 separate locations to cover the surgical field.  A total of 25cc of Exparel was used for the tap block.  The remaining local anesthetic was injected into and around the incisions. ? ? All incisions were reapproximated at the skin with 4-0 monocryl sutures. Dermabond was applied to the skin. The patient tolerated the procedure well and without complications and was transferred to the recovery unit in satisfactory condition.  ? ?Crist Fat, M.D.  ?

## 2021-10-08 NOTE — Anesthesia Preprocedure Evaluation (Addendum)
Anesthesia Evaluation  ?Patient identified by MRN, date of birth, ID band ?Patient awake ? ? ? ?Reviewed: ?Allergy & Precautions, NPO status , Patient's Chart, lab work & pertinent test results ? ?Airway ?Mallampati: I ? ?TM Distance: >3 FB ?Neck ROM: Full ? ? ? Dental ? ?(+) Teeth Intact, Dental Advisory Given ?  ?Pulmonary ?asthma ,  ?  ?breath sounds clear to auscultation ? ? ? ? ? ? Cardiovascular ? ?Rhythm:Regular Rate:Normal ? ? ?  ?Neuro/Psych ?negative neurological ROS ? negative psych ROS  ? GI/Hepatic ?negative GI ROS, Neg liver ROS,   ?Endo/Other  ?Hyperthyroidism  ? Renal/GU ?  ? ?  ?Musculoskeletal ?negative musculoskeletal ROS ?(+)  ? Abdominal ?Normal abdominal exam  (+)   ?Peds ? Hematology ?  ?Anesthesia Other Findings ? ? Reproductive/Obstetrics ? ?  ? ? ? ? ? ? ? ? ? ? ? ? ? ?  ?  ? ? ? ? ? ? ? ?Anesthesia Physical ?Anesthesia Plan ? ?ASA: 2 ? ?Anesthesia Plan: General  ? ?Post-op Pain Management:   ? ?Induction: Intravenous ? ?PONV Risk Score and Plan: 4 or greater and Ondansetron, Dexamethasone, Midazolam and Scopolamine patch - Pre-op ? ?Airway Management Planned: Oral ETT ? ?Additional Equipment: None ? ?Intra-op Plan:  ? ?Post-operative Plan: Extubation in OR ? ?Informed Consent: I have reviewed the patients History and Physical, chart, labs and discussed the procedure including the risks, benefits and alternatives for the proposed anesthesia with the patient or authorized representative who has indicated his/her understanding and acceptance.  ? ? ? ?Dental advisory given ? ?Plan Discussed with: CRNA ? ?Anesthesia Plan Comments:   ? ? ? ? ? ?Anesthesia Quick Evaluation ? ?

## 2021-10-08 NOTE — Anesthesia Procedure Notes (Signed)
Procedure Name: Intubation ?Date/Time: 10/08/2021 9:33 AM ?Performed by: Lavina Hamman, CRNA ?Pre-anesthesia Checklist: Patient identified, Emergency Drugs available, Suction available, Patient being monitored and Timeout performed ?Patient Re-evaluated:Patient Re-evaluated prior to induction ?Oxygen Delivery Method: Circle system utilized ?Preoxygenation: Pre-oxygenation with 100% oxygen ?Induction Type: IV induction ?Ventilation: Mask ventilation without difficulty ?Laryngoscope Size: Mac and 3 ?Grade View: Grade I ?Tube type: Oral ?Tube size: 7.0 mm ?Number of attempts: 1 ?Airway Equipment and Method: Stylet ?Placement Confirmation: ETT inserted through vocal cords under direct vision, positive ETCO2, CO2 detector and breath sounds checked- equal and bilateral ?Secured at: 21 cm ?Tube secured with: Tape ?Dental Injury: Teeth and Oropharynx as per pre-operative assessment  ?Comments: ATOI ? ? ? ? ?

## 2021-10-08 NOTE — Anesthesia Postprocedure Evaluation (Signed)
Anesthesia Post Note ? ?Patient: Veronica Figueroa ? ?Procedure(s) Performed: LAPAROSCOPIC RIGHT RENAL CYST MARSUPILIZATION (Right: Flank) ? ?  ? ?Patient location during evaluation: PACU ?Anesthesia Type: General ?Level of consciousness: awake and alert ?Pain management: pain level controlled ?Vital Signs Assessment: post-procedure vital signs reviewed and stable ?Respiratory status: spontaneous breathing, nonlabored ventilation, respiratory function stable and patient connected to nasal cannula oxygen ?Cardiovascular status: blood pressure returned to baseline and stable ?Postop Assessment: no apparent nausea or vomiting ?Anesthetic complications: no ? ? ?No notable events documented. ? ?Last Vitals:  ?Vitals:  ? 10/08/21 1115 10/08/21 1145  ?BP: (!) 98/49 100/61  ?Pulse: (!) 57 (!) 52  ?Resp: 12 14  ?Temp: 36.4 ?C   ?SpO2: 94% 100%  ?  ?Last Pain:  ?Vitals:  ? 10/08/21 1145  ?TempSrc:   ?PainSc: 2   ? ? ?  ?  ?  ?  ?  ?  ? ?Shelton Silvas ? ? ? ? ?

## 2021-10-08 NOTE — Discharge Instructions (Signed)

## 2021-10-08 NOTE — Transfer of Care (Signed)
Immediate Anesthesia Transfer of Care Note ? ?Patient: Veronica Figueroa ? ?Procedure(s) Performed: Procedure(s) with comments: ?LAPAROSCOPIC RIGHT RENAL CYST MARSUPILIZATION (Right) - 2 HRS FOR THIS CASE ? ?Patient Location: PACU ? ?Anesthesia Type:General ? ?Level of Consciousness:  sedated, patient cooperative and responds to stimulation ? ?Airway & Oxygen Therapy:Patient Spontanous Breathing and Patient connected to face mask oxgen ? ?Post-op Assessment:  Report given to PACU RN and Post -op Vital signs reviewed and stable ? ?Post vital signs:  Reviewed and stable ? ?Last Vitals:  ?Vitals:  ? 10/08/21 0706  ?BP: 105/66  ?Pulse: 60  ?Resp: 13  ?Temp: 36.8 ?C  ?SpO2: 98%  ? ? ?Complications: No apparent anesthesia complications ? ?

## 2021-10-09 ENCOUNTER — Encounter (HOSPITAL_COMMUNITY): Payer: Self-pay | Admitting: Urology

## 2021-10-09 LAB — SURGICAL PATHOLOGY

## 2021-10-22 DIAGNOSIS — N281 Cyst of kidney, acquired: Secondary | ICD-10-CM | POA: Diagnosis not present

## 2021-12-30 DIAGNOSIS — N281 Cyst of kidney, acquired: Secondary | ICD-10-CM | POA: Diagnosis not present

## 2022-03-25 NOTE — Progress Notes (Unsigned)
   Acute Office Visit  Subjective:     Patient ID: Veronica Figueroa, female    DOB: 12-29-1990, 31 y.o.   MRN: [id number]  No chief complaint on file.   HPI Patient is in today for cough, congestion, fevers.   ROS      Objective:    There were no vitals taken for this visit. {Vitals History (Optional):23777}  Physical Exam  No results found for any visits on 03/26/22.      Assessment & Plan:   Problem List Items Addressed This Visit   None   No orders of the defined types were placed in this encounter.   No follow-ups on file.  Charlton Amor, DO

## 2022-03-26 ENCOUNTER — Encounter: Payer: Self-pay | Admitting: Family Medicine

## 2022-03-26 ENCOUNTER — Ambulatory Visit (INDEPENDENT_AMBULATORY_CARE_PROVIDER_SITE_OTHER): Payer: BC Managed Care – PPO | Admitting: Family Medicine

## 2022-03-26 VITALS — BP 112/82 | HR 81 | Temp 98.2°F | Ht 67.0 in | Wt 131.0 lb

## 2022-03-26 DIAGNOSIS — J020 Streptococcal pharyngitis: Secondary | ICD-10-CM | POA: Diagnosis not present

## 2022-03-26 DIAGNOSIS — J101 Influenza due to other identified influenza virus with other respiratory manifestations: Secondary | ICD-10-CM | POA: Diagnosis not present

## 2022-03-26 DIAGNOSIS — R051 Acute cough: Secondary | ICD-10-CM | POA: Diagnosis not present

## 2022-03-26 LAB — POCT INFLUENZA A/B
Influenza A, POC: POSITIVE — AB
Influenza B, POC: POSITIVE — AB

## 2022-03-26 LAB — POCT URINE PREGNANCY: Preg Test, Ur: NEGATIVE

## 2022-03-26 LAB — POCT RAPID STREP A (OFFICE): Rapid Strep A Screen: POSITIVE — AB

## 2022-03-26 MED ORDER — XOFLUZA (40 MG DOSE) 1 X 40 MG PO TBPK: ORAL_TABLET | ORAL | 0 refills | Status: DC

## 2022-03-26 MED ORDER — AMOXICILLIN 500 MG PO CAPS: 500.0000 mg | ORAL_CAPSULE | Freq: Two times a day (BID) | ORAL | 0 refills | Status: AC

## 2022-03-26 NOTE — Assessment & Plan Note (Signed)
-   flu test interpreted by myself. Flu B positive

## 2022-03-26 NOTE — Patient Instructions (Signed)
Warm tea with honey Rest! Rest! Rest!  Tylenol for aches and sore throat

## 2022-03-26 NOTE — Assessment & Plan Note (Addendum)
-   rapid strep test interpreted by myself and indicates positive test result.  - amoxicillin 500mg  BID for 10 days - recommend salt water gargles, fluids, warm tea with honey

## 2022-03-26 NOTE — Assessment & Plan Note (Addendum)
-   test result interpreted by myself positive for flu A  - pt would like xofluza will go ahead and do a UPT to ensure pregnancy test is negative prior to giving medication  - UPT negative. Gave prescription of xofluza however pt is unsure about using this since she is wondering if she is in early pregnancy. Told her that if she is worried about being pregnant and it being too early to catch on a test we can treat flu with supportive care and rest. xofluza is not recommended with pregnancy.

## 2022-03-26 NOTE — Assessment & Plan Note (Signed)
-   continue mucinex for cough  - she has a hx of asthma and has not used her inhaler did recommend her taking her albuterol inhaler for her LLL wheezing

## 2022-03-27 LAB — SPECIMEN STATUS REPORT

## 2022-03-27 LAB — NOVEL CORONAVIRUS, NAA: SARS-CoV-2, NAA: NOT DETECTED

## 2022-03-31 ENCOUNTER — Telehealth: Payer: Self-pay

## 2022-04-30 NOTE — Telephone Encounter (Signed)
error 

## 2022-05-08 DIAGNOSIS — Z3200 Encounter for pregnancy test, result unknown: Secondary | ICD-10-CM | POA: Diagnosis not present

## 2022-05-08 LAB — OB RESULTS CONSOLE ANTIBODY SCREEN: Antibody Screen: NEGATIVE

## 2022-05-29 DIAGNOSIS — Z3201 Encounter for pregnancy test, result positive: Secondary | ICD-10-CM | POA: Diagnosis not present

## 2022-06-15 DIAGNOSIS — O418X10 Other specified disorders of amniotic fluid and membranes, first trimester, not applicable or unspecified: Secondary | ICD-10-CM | POA: Diagnosis not present

## 2022-06-15 DIAGNOSIS — Z3A09 9 weeks gestation of pregnancy: Secondary | ICD-10-CM | POA: Diagnosis not present

## 2022-06-23 DIAGNOSIS — Z3482 Encounter for supervision of other normal pregnancy, second trimester: Secondary | ICD-10-CM | POA: Diagnosis not present

## 2022-06-23 DIAGNOSIS — Z3689 Encounter for other specified antenatal screening: Secondary | ICD-10-CM | POA: Diagnosis not present

## 2022-06-23 LAB — OB RESULTS CONSOLE GC/CHLAMYDIA
Chlamydia: NEGATIVE
Neisseria Gonorrhea: NEGATIVE

## 2022-06-23 LAB — OB RESULTS CONSOLE RUBELLA ANTIBODY, IGM: Rubella: IMMUNE

## 2022-06-23 LAB — OB RESULTS CONSOLE HIV ANTIBODY (ROUTINE TESTING): HIV: NONREACTIVE

## 2022-06-23 LAB — OB RESULTS CONSOLE HEPATITIS B SURFACE ANTIGEN: Hepatitis B Surface Ag: NEGATIVE

## 2022-06-23 LAB — HEPATITIS C ANTIBODY: HCV Ab: NEGATIVE

## 2022-06-23 LAB — OB RESULTS CONSOLE RPR: RPR: NONREACTIVE

## 2022-07-21 DIAGNOSIS — Z3482 Encounter for supervision of other normal pregnancy, second trimester: Secondary | ICD-10-CM | POA: Diagnosis not present

## 2022-07-21 DIAGNOSIS — Z361 Encounter for antenatal screening for raised alphafetoprotein level: Secondary | ICD-10-CM | POA: Diagnosis not present

## 2022-07-21 DIAGNOSIS — Z3685 Encounter for antenatal screening for Streptococcus B: Secondary | ICD-10-CM | POA: Diagnosis not present

## 2022-07-21 DIAGNOSIS — Z3689 Encounter for other specified antenatal screening: Secondary | ICD-10-CM | POA: Diagnosis not present

## 2022-08-03 NOTE — L&D Delivery Note (Signed)
   Delivery Note:   [id number] at [redacted]w[redacted]d  Admitting diagnosis: Indication for care in labor or delivery [O75.9] Risks: GBS+ Onset of labor: 01/14/2023 at 1557, clear fluid IOL/Augmentation: AROM and Pitocin ROM: 01/14/2023 at 1557, clear fluid  Complete dilation at 01/14/2023  2030 Onset of pushing at 2030 FHR second stage Cat I  Analgesia/Anesthesia intrapartum:Epidural  Pushing in lithotomy position with CNM and L&D staff support. Husband, Cletis Athens, present for birth and supportive.  Delivery of a Live born female  Birth Weight:  pending APGAR: 9, 9   Newborn Delivery   Birth date/time: 01/14/2023 20:34:00 Delivery type: Vaginal, Spontaneous     in cephalic presentation, position OA to ROA.  APGAR:1 min-9 , 5 min-9   Nuchal Cord: No  Cord double clamped after cessation of pulsation, cut by Cletis Athens.  Collection of cord blood for typing completed. Arterial cord blood sample-No    Placenta delivered-Spontaneous  with 3 vessels . Uterotonics: Pitocin Placenta to L&D Uterine tone firm  Bleeding small  1st degree;Vaginal  laceration identified.  Episiotomy:None  Local analgesia: N/A  Repair: 3-0 in usual fashion Est. Blood Loss (mL):168.00   Complications: None  Mom to postpartum. Baby Aila to Couplet care / Skin to Skin.  Delivery Report:   Review the Delivery Report for details.    June Leap, CNM, MSN 01/14/2023, 8:55 PM

## 2022-08-20 DIAGNOSIS — Z363 Encounter for antenatal screening for malformations: Secondary | ICD-10-CM | POA: Diagnosis not present

## 2022-09-07 ENCOUNTER — Encounter (HOSPITAL_COMMUNITY): Payer: Self-pay

## 2022-09-07 ENCOUNTER — Inpatient Hospital Stay (HOSPITAL_BASED_OUTPATIENT_CLINIC_OR_DEPARTMENT_OTHER): Payer: BC Managed Care – PPO

## 2022-09-07 ENCOUNTER — Other Ambulatory Visit: Payer: Self-pay

## 2022-09-07 ENCOUNTER — Inpatient Hospital Stay (HOSPITAL_COMMUNITY)
Admission: AD | Admit: 2022-09-07 | Discharge: 2022-09-07 | Disposition: A | Discharge status: Home / Self Care | Payer: BC Managed Care – PPO | Attending: Obstetrics and Gynecology | Admitting: Obstetrics and Gynecology

## 2022-09-07 DIAGNOSIS — O321XX0 Maternal care for breech presentation, not applicable or unspecified: Secondary | ICD-10-CM | POA: Insufficient documentation

## 2022-09-07 DIAGNOSIS — Z3A21 21 weeks gestation of pregnancy: Secondary | ICD-10-CM

## 2022-09-07 DIAGNOSIS — O4452 Low lying placenta with hemorrhage, second trimester: Secondary | ICD-10-CM | POA: Insufficient documentation

## 2022-09-07 DIAGNOSIS — O4442 Low lying placenta NOS or without hemorrhage, second trimester: Secondary | ICD-10-CM | POA: Diagnosis not present

## 2022-09-07 DIAGNOSIS — N841 Polyp of cervix uteri: Secondary | ICD-10-CM

## 2022-09-07 DIAGNOSIS — O3442 Maternal care for other abnormalities of cervix, second trimester: Secondary | ICD-10-CM | POA: Diagnosis not present

## 2022-09-07 DIAGNOSIS — O4440 Low lying placenta NOS or without hemorrhage, unspecified trimester: Secondary | ICD-10-CM

## 2022-09-07 DIAGNOSIS — O4692 Antepartum hemorrhage, unspecified, second trimester: Secondary | ICD-10-CM | POA: Diagnosis not present

## 2022-09-07 LAB — URINALYSIS, ROUTINE W REFLEX MICROSCOPIC
Bacteria, UA: NONE SEEN
Bilirubin Urine: NEGATIVE
Glucose, UA: NEGATIVE mg/dL
Ketones, ur: 5 mg/dL — AB
Leukocytes,Ua: NEGATIVE
Nitrite: NEGATIVE
Protein, ur: NEGATIVE mg/dL
Specific Gravity, Urine: 1.018 (ref 1.005–1.030)
pH: 6 (ref 5.0–8.0)

## 2022-09-07 NOTE — Discharge Instructions (Signed)

## 2022-09-07 NOTE — MAU Note (Signed)
.  Veronica Figueroa is a 32 y.o. at [redacted]w[redacted]d here in MAU reporting: having "heavy spotting over the weekend, went away, but started again today." At 1630 is when she first noticed blood today - called OB office was told to monitor it but come in if it got worse. States she put a piece of folded up toilet paper in underwear and around 1700 noticed toilet paper was filled with blood. States bleeding is bright red like a period. States no pain, just discomfort on both sides of abdomen. States anatomy scan showed posterior, low lying placenta. Had intercourse on Saturday when the bleeding initially started. Denies any LOF or abnormal discharge. Endorses FM.   Onset of complaint: 1630 Pain score: 2 Vitals:   09/07/22 1939  BP: 107/61  Pulse: 77  Resp: 19  Temp: 98.4 F (36.9 C)  SpO2: 100%     FHT:145 Lab orders placed from triage:  UA

## 2022-09-07 NOTE — MAU Provider Note (Signed)
History     CSN: [id number]  Arrival date and time: 09/07/22 1853   Event Date/Time   First Provider Initiated Contact with Patient 09/07/22 1959      Chief Complaint  Patient presents with   Vaginal Bleeding   HPI  Veronica Figueroa is a 32 y.o. [id number] at [redacted]w[redacted]d who presents for evaluation of vaginal bleeding. Patient reports she first noticed it Saturday after intercourse. It was just spotting until today when it became heavier. She denies any pain.  She denies any discharge and leaking of fluid. Denies any constipation, diarrhea or any urinary complaints. Reports normal fetal movement.   OB History     Gravida  3   Para  2   Term  2   Preterm  0   AB  0   Living  2      SAB  0   IAB  0   Ectopic  0   Multiple  0   Live Births  2           Past Medical History:  Diagnosis Date   Asthma    Hyperthyroidism    No longer have after chiildbirth    Past Surgical History:  Procedure Laterality Date   LAPAROSCOPIC PARTIAL NEPHRECTOMY Right 10/08/2021   Procedure: LAPAROSCOPIC RIGHT RENAL CYST MARSUPILIZATION;  Surgeon: Ardis Hughs, MD;  Location: [facility];  Service: Urology;  Laterality: Right;  2 HRS FOR THIS CASE   TONSILLECTOMY     WISDOM TOOTH EXTRACTION      Family History  Problem Relation Age of Onset   Heart attack Mother     Social History   Tobacco Use   Smoking status: Never   Smokeless tobacco: Never  Vaping Use   Vaping Use: Never used  Substance Use Topics   Alcohol use: Not Currently    Alcohol/week: 4.0 standard drinks of alcohol    Types: 4 Standard drinks or equivalent per week    Comment: Occasional   Drug use: Never    Allergies: No Known Allergies  Medications Prior to Admission  Medication Sig Dispense Refill Last Dose   Ascorbic Acid (VITAMIN C) 1000 MG tablet Take 500 mg by mouth daily.      Omega-3 Fatty Acids (FISH OIL PO) Take 1 capsule by mouth daily.    09/07/2022   Prenatal Vit-Fe Fumarate-FA (PRENATAL  MULTIVITAMIN) TABS tablet Take 1 tablet by mouth daily at 12 noon.   09/07/2022   VITAMIN D PO Take 1 capsule by mouth daily.    09/07/2022   albuterol (VENTOLIN HFA) 108 (90 Base) MCG/ACT inhaler INHALE 1 PUFF INTO THE LUNGS EVERY 6 HOURS AS NEEDED 18 g 0 More than a month   Baloxavir Marboxil,40 MG Dose, (XOFLUZA, 40 MG DOSE,) 1 x 40 MG TBPK Take one 40mg  tablet for one day. (Patient not taking: Reported on 09/07/2022) 1 each 0 Not Taking   cetirizine (ZYRTEC) 10 MG tablet Take 10 mg by mouth daily as needed for allergies.   More than a month   norethindrone (MICRONOR) 0.35 MG tablet Take 1 tablet by mouth daily. (Patient not taking: Reported on 09/07/2022)   Not Taking   OVER THE COUNTER MEDICATION Take 1 tablet by mouth daily. Sunflower Lecithin (Patient not taking: Reported on 09/07/2022)   Not Taking    Review of Systems  Constitutional: Negative.  Negative for fatigue and fever.  HENT: Negative.    Respiratory: Negative.  Negative for shortness of breath.  Cardiovascular: Negative.  Negative for chest pain.  Gastrointestinal: Negative.  Negative for abdominal pain, constipation, diarrhea, nausea and vomiting.  Genitourinary:  Positive for vaginal bleeding. Negative for dysuria and vaginal discharge.  Neurological: Negative.  Negative for dizziness and headaches.   Physical Exam   Blood pressure 107/61, pulse 77, temperature 98.4 F (36.9 C), temperature source Oral, resp. rate 19, height 5\' 6"  (1.676 m), weight 69.5 kg, SpO2 100 %, currently breastfeeding.  Patient Vitals for the past 24 hrs:  BP Temp Temp src Pulse Resp SpO2 Height Weight  09/07/22 1939 107/61 98.4 F (36.9 C) Oral 77 19 100 % 5\' 6"  (1.676 m) 69.5 kg    Physical Exam Vitals and nursing note reviewed.  Constitutional:      General: She is not in acute distress.    Appearance: She is well-developed.  HENT:     Head: Normocephalic.  Eyes:     Pupils: Pupils are equal, round, and reactive to light.  Cardiovascular:      Rate and Rhythm: Normal rate and regular rhythm.     Heart sounds: Normal heart sounds.  Pulmonary:     Effort: Pulmonary effort is normal. No respiratory distress.     Breath sounds: Normal breath sounds.  Abdominal:     General: Bowel sounds are normal. There is no distension.     Palpations: Abdomen is soft.     Tenderness: There is no abdominal tenderness.  Genitourinary:       Comments: Small amount of bright red bleeding in vault Skin:    General: Skin is warm and dry.  Neurological:     Mental Status: She is alert and oriented to person, place, and time.  Psychiatric:        Mood and Affect: Mood normal.        Behavior: Behavior normal.        Thought Content: Thought content normal.        Judgment: Judgment normal.     FHT: 145 bpm   MAU Course  Procedures       MDM Prenatal records from community office reviewed. Pregnancy complicated by low lying placenta per patient report Labs ordered and reviewed.   UA [place] MFM OB Limited A Pos  Confirmed low lying placenta, not previa  Assessment and Plan   1. Cervical polyp   2. Low-lying placenta   3. [redacted] weeks gestation of pregnancy     -Discharge home in stable condition -Vaginal bleeding and pelvic rest precautions discussed -Patient advised to follow-up with OB as scheduled for prenatal care -Patient may return to MAU as needed or if her condition were to change or worsen  Wende Mott, CNM 09/07/2022, 7:59 PM

## 2022-09-11 DIAGNOSIS — O26892 Other specified pregnancy related conditions, second trimester: Secondary | ICD-10-CM | POA: Diagnosis not present

## 2022-09-11 DIAGNOSIS — Z8639 Personal history of other endocrine, nutritional and metabolic disease: Secondary | ICD-10-CM | POA: Diagnosis not present

## 2022-09-11 DIAGNOSIS — Z362 Encounter for other antenatal screening follow-up: Secondary | ICD-10-CM | POA: Diagnosis not present

## 2022-09-11 DIAGNOSIS — N841 Polyp of cervix uteri: Secondary | ICD-10-CM | POA: Diagnosis not present

## 2022-09-11 DIAGNOSIS — Z3A22 22 weeks gestation of pregnancy: Secondary | ICD-10-CM | POA: Diagnosis not present

## 2022-10-08 DIAGNOSIS — H9201 Otalgia, right ear: Secondary | ICD-10-CM | POA: Diagnosis not present

## 2022-10-12 ENCOUNTER — Encounter: Payer: Self-pay | Admitting: Physician Assistant

## 2022-10-12 ENCOUNTER — Ambulatory Visit (INDEPENDENT_AMBULATORY_CARE_PROVIDER_SITE_OTHER): Payer: BC Managed Care – PPO | Admitting: Physician Assistant

## 2022-10-12 VITALS — BP 101/59 | HR 79 | Temp 98.0°F | Ht 66.0 in | Wt 156.0 lb

## 2022-10-12 DIAGNOSIS — H6501 Acute serous otitis media, right ear: Secondary | ICD-10-CM

## 2022-10-12 NOTE — Patient Instructions (Signed)

## 2022-10-12 NOTE — Progress Notes (Signed)
   Acute Office Visit  Subjective:     Patient ID: Veronica Figueroa, female    DOB: Jan 18, 1991, 32 y.o.   MRN: [id number]  Chief Complaint  Patient presents with   Hearing Problem    HPI Patient is in today for right ear pressure and hearing loss. She is 26 weeks and 6 days pregnant. She went to UC last week and was given told to use tylenol for any ear pain but no infection was found. She was given amoxil to use if symptoms persistent but she has not started it. Since the pain has improved but she has a lot of pressure and now hearing loss. No ear drainage, fever, chills, body aches. Her right maxillary sinuses do feel like they have a lot of pressure.   .. Active Ambulatory Problems    Diagnosis Date Noted   Normal labor 04/05/2021   Positive GBS test 04/05/2021   History of hyperthyroidism 04/05/2021   SVD 9/4 04/06/2021   Postpartum care following vaginal delivery 9/4 04/06/2021   First degree vaginal laceration 04/06/2021   Renal cyst, right 08/26/2021   Acute cough 03/26/2022   Resolved Ambulatory Problems    Diagnosis Date Noted   Mass of right third toe 10/14/2018   Exercise-induced asthma 10/14/2018   Normal labor and delivery 08/15/2019   SVD (spontaneous vaginal delivery) 08/15/2019   Postpartum care following vaginal delivery (1/12) 08/15/2019   Obstetrical laceration: median episiotomy  08/15/2019   Hyperthyroidism 02/28/2020   Strep pharyngitis 03/26/2022   Influenza A 03/26/2022   Influenza B 03/26/2022   Past Medical History:  Diagnosis Date   Asthma      ROS See HPI.      Objective:    BP (!) 101/59   Pulse 79   Temp 98 F (36.7 C) (Oral)   Ht 5\' 6"  (1.676 m)   Wt 156 lb (70.8 kg)   SpO2 100%   BMI 25.18 kg/m  BP Readings from Last 3 Encounters:  10/12/22 (!) 101/59  09/07/22 107/61  03/26/22 112/82   Wt Readings from Last 3 Encounters:  10/12/22 156 lb (70.8 kg)  09/07/22 153 lb 4.8 oz (69.5 kg)  03/26/22 131 lb (59.4 kg)       Physical Exam Right TM erythematous, bulging, with middle ear effusion.  Left TM clear.  Oropharynx erythematous.      Assessment & Plan:  Marland KitchenMarland KitchenApril was seen today for hearing problem.  Diagnoses and all orders for this visit:  Non-recurrent acute serous otitis media of right ear   Start amoxil for otitis media HO given Ok to use tylenol for any pain Flonase for nasal congestion and to help with ETD Follow up as needed or if symptoms worsen or hearing does not come back  [employer], PA-C

## 2022-10-22 DIAGNOSIS — O4443 Low lying placenta NOS or without hemorrhage, third trimester: Secondary | ICD-10-CM | POA: Diagnosis not present

## 2022-10-22 DIAGNOSIS — Z3689 Encounter for other specified antenatal screening: Secondary | ICD-10-CM | POA: Diagnosis not present

## 2022-10-22 DIAGNOSIS — Z3A28 28 weeks gestation of pregnancy: Secondary | ICD-10-CM | POA: Diagnosis not present

## 2022-10-27 ENCOUNTER — Encounter: Payer: Self-pay | Admitting: Medical-Surgical

## 2022-10-27 ENCOUNTER — Ambulatory Visit (INDEPENDENT_AMBULATORY_CARE_PROVIDER_SITE_OTHER): Payer: BC Managed Care – PPO | Admitting: Medical-Surgical

## 2022-10-27 VITALS — BP 96/63 | HR 80 | Resp 20 | Ht 66.0 in | Wt 158.4 lb

## 2022-10-27 DIAGNOSIS — H6501 Acute serous otitis media, right ear: Secondary | ICD-10-CM | POA: Insufficient documentation

## 2022-10-27 MED ORDER — FLUTICASONE PROPIONATE 50 MCG/ACT NA SUSP: 2.0000 | Freq: Every day | NASAL | 6 refills | Status: DC

## 2022-10-27 MED ORDER — CEFDINIR 300 MG PO CAPS: 300.0000 mg | ORAL_CAPSULE | Freq: Two times a day (BID) | ORAL | 0 refills | Status: DC

## 2022-10-27 NOTE — Progress Notes (Addendum)
        Established patient visit  History, exam, impression, and plan:  Non-recurrent acute serous otitis media of right ear Urgently seen in urgent care on 3/7 and was told that she did not have an ear infection and to use Tylenol for ear pain as needed.  They did provide her with a prescription for amoxicillin to start if her symptoms did not improve or if she developed worsening pain.  On 3/11, she was seen in our office due to worsening ear pain.  On evaluation, she was found to have otitis media of the right ear and instructed to start the amoxicillin.  Today she comes in reporting that the ear pain has resolved and her symptoms are much better however she does still feel like her right ear is not back to normal.  She has muffled hearing, ringing in the ear, and a sensation of fullness.  No fever, upper respiratory congestion, ear pain, cough, or sore throat.  Was instructed to use Flonase at her appointment on 3/11 however she does note that she does not like using nasal sprays so she never started this.  On exam, her right TM does appear to be slightly bulging with a serous effusion.  There is some mild redness of the TM.  Findings are consistent with continued eustachian tube dysfunction however with the mild redness still present, concern for incomplete resolution of the infection.  After discussion, strongly recommend starting Flonase even for a couple of weeks.  Reviewed appropriate administration technique for Flonase.  Since she is pregnant, plan to err on the side of caution and retreat with cefdinir 300 mg twice daily x 7 days.  Advised that eustachian tube dysfunction can last for weeks before finally resolving.   Procedures performed this visit: None.  Return if symptoms worsen or fail to improve.  __________________________________ Clearnce Sorrel, DNP, APRN, FNP-BC [facility] [place]

## 2022-10-27 NOTE — Assessment & Plan Note (Signed)
Urgently seen in urgent care on 3/7 and was told that she did not have an ear infection and to use Tylenol for ear pain as needed.  They did provide her with a prescription for amoxicillin to start if her symptoms did not improve or if she developed worsening pain.  On 3/11, she was seen in our office due to worsening ear pain.  On evaluation, she was found to have otitis media of the right ear and instructed to start the amoxicillin.  Today she comes in reporting that the ear pain has resolved and her symptoms are much better however she does still feel like her right ear is not back to normal.  She has muffled hearing, ringing in the ear, and a sensation of fullness.  No fever, upper respiratory congestion, ear pain, cough, or sore throat.  Was instructed to use Flonase at her appointment on 3/11 however she does note that she does not like using nasal sprays so she never started this.  On exam, her right TM does appear to be slightly bulging with a serous effusion.  There is some mild redness of the TM.  Findings are consistent with continued eustachian tube dysfunction however with the mild redness still present, concern for incomplete resolution of the infection.  After discussion, strongly recommend starting Flonase even for a couple of weeks.  Reviewed appropriate administration technique for Flonase.  Since she is pregnant, plan to err on the side of caution and retreat with cefdinir 300 mg twice daily x 7 days.  Advised that eustachian tube dysfunction can last for weeks before finally resolving.

## 2022-12-18 DIAGNOSIS — Z3685 Encounter for antenatal screening for Streptococcus B: Secondary | ICD-10-CM | POA: Diagnosis not present

## 2022-12-18 LAB — OB RESULTS CONSOLE GBS: GBS: POSITIVE

## 2023-01-13 ENCOUNTER — Telehealth (HOSPITAL_COMMUNITY): Payer: Self-pay | Admitting: *Deleted

## 2023-01-13 NOTE — Telephone Encounter (Signed)
Preadmission screen  

## 2023-01-14 ENCOUNTER — Inpatient Hospital Stay (HOSPITAL_COMMUNITY): Payer: BC Managed Care – PPO | Admitting: Anesthesiology

## 2023-01-14 ENCOUNTER — Other Ambulatory Visit: Payer: Self-pay

## 2023-01-14 ENCOUNTER — Inpatient Hospital Stay (HOSPITAL_COMMUNITY)
Admission: AD | Admit: 2023-01-14 | Discharge: 2023-01-16 | DRG: 807 | Disposition: A | Discharge status: Home / Self Care | Payer: BC Managed Care – PPO | Attending: Obstetrics | Admitting: Obstetrics

## 2023-01-14 ENCOUNTER — Encounter (HOSPITAL_COMMUNITY): Payer: Self-pay | Admitting: Obstetrics

## 2023-01-14 DIAGNOSIS — O9952 Diseases of the respiratory system complicating childbirth: Secondary | ICD-10-CM | POA: Diagnosis not present

## 2023-01-14 DIAGNOSIS — J45909 Unspecified asthma, uncomplicated: Secondary | ICD-10-CM | POA: Diagnosis not present

## 2023-01-14 DIAGNOSIS — Z3A40 40 weeks gestation of pregnancy: Secondary | ICD-10-CM | POA: Diagnosis not present

## 2023-01-14 DIAGNOSIS — B951 Streptococcus, group B, as the cause of diseases classified elsewhere: Secondary | ICD-10-CM | POA: Diagnosis present

## 2023-01-14 DIAGNOSIS — O480 Post-term pregnancy: Principal | ICD-10-CM | POA: Diagnosis present

## 2023-01-14 DIAGNOSIS — O99824 Streptococcus B carrier state complicating childbirth: Secondary | ICD-10-CM | POA: Diagnosis not present

## 2023-01-14 LAB — TYPE AND SCREEN
ABO/RH(D): A POS
Antibody Screen: NEGATIVE

## 2023-01-14 LAB — CBC
HCT: 41.9 % (ref 36.0–46.0)
Hemoglobin: 14.4 g/dL (ref 12.0–15.0)
MCH: 31.9 pg (ref 26.0–34.0)
MCHC: 34.4 g/dL (ref 30.0–36.0)
MCV: 92.7 fL (ref 80.0–100.0)
Platelets: 191 10*3/uL (ref 150–400)
RBC: 4.52 MIL/uL (ref 3.87–5.11)
RDW: 13.2 % (ref 11.5–15.5)
WBC: 11 10*3/uL — ABNORMAL HIGH (ref 4.0–10.5)
nRBC: 0 % (ref 0.0–0.2)

## 2023-01-14 MED ORDER — SENNOSIDES-DOCUSATE SODIUM 8.6-50 MG PO TABS
2.0000 | ORAL_TABLET | Freq: Every day | ORAL | Status: DC
  Administered 2023-01-15: 2 via ORAL
  Filled 2023-01-14 (×2): qty 2

## 2023-01-14 MED ORDER — PENICILLIN G POT IN DEXTROSE 60000 UNIT/ML IV SOLN
3.0000 10*6.[IU] | INTRAVENOUS | Status: DC
  Administered 2023-01-14 (×2): 3 10*6.[IU] via INTRAVENOUS
  Filled 2023-01-14 (×2): qty 50

## 2023-01-14 MED ORDER — DIPHENHYDRAMINE HCL 25 MG PO CAPS: 25.0000 mg | ORAL_CAPSULE | Freq: Four times a day (QID) | ORAL | Status: DC | PRN

## 2023-01-14 MED ORDER — OXYTOCIN-SODIUM CHLORIDE 30-0.9 UT/500ML-% IV SOLN: 2.5000 [IU]/h | INTRAVENOUS | Status: DC

## 2023-01-14 MED ORDER — DIBUCAINE (PERIANAL) 1 % EX OINT: 1.0000 | TOPICAL_OINTMENT | CUTANEOUS | Status: DC | PRN

## 2023-01-14 MED ORDER — TERBUTALINE SULFATE 1 MG/ML IJ SOLN: 0.2500 mg | Freq: Once | INTRAMUSCULAR | Status: DC | PRN

## 2023-01-14 MED ORDER — ZOLPIDEM TARTRATE 5 MG PO TABS: 5.0000 mg | ORAL_TABLET | Freq: Every evening | ORAL | Status: DC | PRN

## 2023-01-14 MED ORDER — PHENYLEPHRINE 80 MCG/ML (10ML) SYRINGE FOR IV PUSH (FOR BLOOD PRESSURE SUPPORT): 80.0000 ug | PREFILLED_SYRINGE | INTRAVENOUS | Status: DC | PRN

## 2023-01-14 MED ORDER — LACTATED RINGERS IV SOLN: INTRAVENOUS | Status: DC

## 2023-01-14 MED ORDER — ONDANSETRON HCL 4 MG/2ML IJ SOLN: 4.0000 mg | Freq: Four times a day (QID) | INTRAMUSCULAR | Status: DC | PRN

## 2023-01-14 MED ORDER — LACTATED RINGERS IV SOLN: 500.0000 mL | INTRAVENOUS | Status: DC | PRN

## 2023-01-14 MED ORDER — LACTATED RINGERS IV SOLN: 500.0000 mL | Freq: Once | INTRAVENOUS | Status: DC

## 2023-01-14 MED ORDER — EPHEDRINE 5 MG/ML INJ: 10.0000 mg | INTRAVENOUS | Status: DC | PRN

## 2023-01-14 MED ORDER — IBUPROFEN 600 MG PO TABS
600.0000 mg | ORAL_TABLET | Freq: Four times a day (QID) | ORAL | Status: DC
  Administered 2023-01-15 – 2023-01-16 (×6): 600 mg via ORAL
  Filled 2023-01-14 (×6): qty 1

## 2023-01-14 MED ORDER — SIMETHICONE 80 MG PO CHEW: 80.0000 mg | CHEWABLE_TABLET | ORAL | Status: DC | PRN

## 2023-01-14 MED ORDER — DIPHENHYDRAMINE HCL 50 MG/ML IJ SOLN: 12.5000 mg | INTRAMUSCULAR | Status: DC | PRN

## 2023-01-14 MED ORDER — PHENYLEPHRINE 80 MCG/ML (10ML) SYRINGE FOR IV PUSH (FOR BLOOD PRESSURE SUPPORT)
80.0000 ug | PREFILLED_SYRINGE | INTRAVENOUS | Status: DC | PRN
  Filled 2023-01-14: qty 10

## 2023-01-14 MED ORDER — ACETAMINOPHEN 325 MG PO TABS: 650.0000 mg | ORAL_TABLET | ORAL | Status: DC | PRN

## 2023-01-14 MED ORDER — ACETAMINOPHEN 325 MG PO TABS
650.0000 mg | ORAL_TABLET | ORAL | Status: DC | PRN
  Administered 2023-01-15 – 2023-01-16 (×4): 650 mg via ORAL
  Filled 2023-01-14 (×4): qty 2

## 2023-01-14 MED ORDER — OXYCODONE-ACETAMINOPHEN 5-325 MG PO TABS: 2.0000 | ORAL_TABLET | ORAL | Status: DC | PRN

## 2023-01-14 MED ORDER — OXYTOCIN BOLUS FROM INFUSION
333.0000 mL | Freq: Once | INTRAVENOUS | Status: AC
  Administered 2023-01-14: 333 mL via INTRAVENOUS

## 2023-01-14 MED ORDER — LIDOCAINE HCL (PF) 1 % IJ SOLN: 30.0000 mL | INTRAMUSCULAR | Status: DC | PRN

## 2023-01-14 MED ORDER — PRENATAL MULTIVITAMIN CH
1.0000 | ORAL_TABLET | Freq: Every day | ORAL | Status: DC
  Administered 2023-01-15: 1 via ORAL
  Filled 2023-01-14: qty 1

## 2023-01-14 MED ORDER — FENTANYL-BUPIVACAINE-NACL 0.5-0.125-0.9 MG/250ML-% EP SOLN
12.0000 mL/h | EPIDURAL | Status: DC | PRN
  Administered 2023-01-14: 12 mL/h via EPIDURAL
  Filled 2023-01-14: qty 250

## 2023-01-14 MED ORDER — TETANUS-DIPHTH-ACELL PERTUSSIS 5-2.5-18.5 LF-MCG/0.5 IM SUSY: 0.5000 mL | PREFILLED_SYRINGE | Freq: Once | INTRAMUSCULAR | Status: DC

## 2023-01-14 MED ORDER — OXYCODONE-ACETAMINOPHEN 5-325 MG PO TABS: 1.0000 | ORAL_TABLET | ORAL | Status: DC | PRN

## 2023-01-14 MED ORDER — SOD CITRATE-CITRIC ACID 500-334 MG/5ML PO SOLN: 30.0000 mL | ORAL | Status: DC | PRN

## 2023-01-14 MED ORDER — BENZOCAINE-MENTHOL 20-0.5 % EX AERO
1.0000 | INHALATION_SPRAY | CUTANEOUS | Status: DC | PRN
  Administered 2023-01-15: 1 via TOPICAL
  Filled 2023-01-14: qty 56

## 2023-01-14 MED ORDER — SODIUM CHLORIDE 0.9 % IV SOLN
5.0000 10*6.[IU] | Freq: Once | INTRAVENOUS | Status: AC
  Administered 2023-01-14: 5 10*6.[IU] via INTRAVENOUS
  Filled 2023-01-14: qty 5

## 2023-01-14 MED ORDER — ONDANSETRON HCL 4 MG/2ML IJ SOLN: 4.0000 mg | INTRAMUSCULAR | Status: DC | PRN

## 2023-01-14 MED ORDER — COCONUT OIL OIL: 1.0000 | TOPICAL_OIL | Status: DC | PRN

## 2023-01-14 MED ORDER — WITCH HAZEL-GLYCERIN EX PADS: 1.0000 | MEDICATED_PAD | CUTANEOUS | Status: DC | PRN

## 2023-01-14 MED ORDER — ONDANSETRON HCL 4 MG PO TABS: 4.0000 mg | ORAL_TABLET | ORAL | Status: DC | PRN

## 2023-01-14 MED ORDER — OXYTOCIN-SODIUM CHLORIDE 30-0.9 UT/500ML-% IV SOLN
1.0000 m[IU]/min | INTRAVENOUS | Status: DC
  Administered 2023-01-14: 2 m[IU]/min via INTRAVENOUS
  Filled 2023-01-14: qty 500

## 2023-01-14 MED ORDER — LIDOCAINE-EPINEPHRINE (PF) 2 %-1:200000 IJ SOLN
INTRAMUSCULAR | Status: DC | PRN
  Administered 2023-01-14: 5 mL via EPIDURAL

## 2023-01-14 NOTE — Progress Notes (Signed)
S: Comfortable with epidural. Discussed the R/B/A of AROM for augmentation and patient consents to procedure. Husband, Christiane Ha, present and supportive.   O: Vitals:   01/14/23 1554 01/14/23 1601 01/14/23 1602 01/14/23 1617  BP: (!) 87/66 110/62 119/77 96/70  Pulse: 81 86 95 67  Resp:      SpO2:       FHT:  FHR: 125 bpm, variability: moderate,  accelerations:  Present,  decelerations:  Absent UC:   irregular SVE:   Dilation: 4 Effacement (%): 60 Station: -1 Exam by:: a Clytie Shetley  AROM of a large amount of clear fluid at 1557.   A / P: Augmentation of labor, progressing well  Fetal Wellbeing:  Category I GBS: Positive, 2 doses of PCN, continue until delivery Pain Control:  Epidural Anticipated MOD:  NSVD  Will start Pitocin 2x2 in 4 hours if no active labor.   June Leap, CNM, MSN 01/14/2023, 4:19 PM

## 2023-01-14 NOTE — Progress Notes (Signed)
Report called to receiving nurse

## 2023-01-14 NOTE — H&P (Signed)
OB ADMISSION/ HISTORY & PHYSICAL:  Admission Date: 01/14/2023 11:00 AM  Admit Diagnosis: Indication for care in labor or delivery [O75.9]    Veronica Figueroa is a 32 y.o. female G3P2002 at [redacted]w[redacted]d presenting for early labor. Patient was seen in the office yesterday and had a membrane sweep. Reports leaking of fluid and contractions overnight. Was seen this AM in the office and was 4cm, negative ROM, and GBS is positive. Discussed direct admission for IV antibiotics and augmentation with AROM after 2nd dose of antibiotics and patient agrees. Denies vaginal bleeding. Endorses + fetal movement. Husband, Christiane Ha, present and supportive. Eagerly anticipating a baby girl with a surprise name!  Prenatal History: [id number]   EDC: 01/12/2023 Prenatal care at [facility] since 11 weeks  Primary: M. Sigmon, CNM  Prenatal course complicated by: History of PP thyroiditis with G1, no meds History of anxiety, no current meds Cervical polyp in pregnancy with bleeding, removed in 2T Low lying placenta at [facility], resolved at 28 weeks  Prenatal Labs: ABO, Rh:   A POS Antibody: NEG (06/13 1123) Rubella: Immune (11/21 0000)  RPR: Nonreactive (11/21 0000)  HBsAg: Negative (11/21 0000)  HIV: Non-reactive (11/21 0000)  GBS: Positive/-- (05/17 0000)  1 hr Glucola : 84 Genetic Screening: Declined Ultrasound: normal XX anatomy, posterior placenta    Maternal Diabetes: No Genetic Screening: Declined Maternal Ultrasounds/Referrals: Normal Fetal Ultrasounds or other Referrals:  None Maternal Substance Abuse:  No Significant Maternal Medications:  None Significant Maternal Lab Results:  Group B Strep positive Other Comments:  None  Medical / Surgical History : Past medical history:  Past Medical History:  Diagnosis Date   Asthma     Past surgical history:  Past Surgical History:  Procedure Laterality Date   LAPAROSCOPIC PARTIAL NEPHRECTOMY Right 10/08/2021   Procedure: LAPAROSCOPIC RIGHT RENAL CYST  MARSUPILIZATION;  Surgeon: Crist Fat, MD;  Location: [facility];  Service: Urology;  Laterality: Right;  2 HRS FOR THIS CASE   TONSILLECTOMY     WISDOM TOOTH EXTRACTION      Family History:  Family History  Problem Relation Age of Onset   Heart attack Mother     Social History:  reports that she has never smoked. She has never used smokeless tobacco. She reports that she does not currently use alcohol after a past usage of about 4.0 standard drinks of alcohol per week. She reports that she does not use drugs.  Allergies: Patient has no known allergies.   Current Medications at time of admission:  Medications Prior to Admission  Medication Sig Dispense Refill Last Dose   albuterol (VENTOLIN HFA) 108 (90 Base) MCG/ACT inhaler INHALE 1 PUFF INTO THE LUNGS EVERY 6 HOURS AS NEEDED 18 g 0    cefdinir (OMNICEF) 300 MG capsule Take 1 capsule (300 mg total) by mouth 2 (two) times daily. 14 capsule 0    cetirizine (ZYRTEC) 10 MG tablet Take 10 mg by mouth daily as needed for allergies.      fluticasone (FLONASE) 50 MCG/ACT nasal spray Place 2 sprays into both nostrils daily. 16 g 6    Omega-3 Fatty Acids (FISH OIL PO) Take 1 capsule by mouth daily.       Prenatal Vit-Fe Fumarate-FA (PRENATAL MULTIVITAMIN) TABS tablet Take 1 tablet by mouth daily at 12 noon.      VITAMIN D PO Take 1 capsule by mouth daily.        Review of Systems: Review of Systems  All other systems  reviewed and are negative.  Physical Exam: Vital signs and nursing notes reviewed.  Patient Vitals for the past 24 hrs:  BP Pulse Resp SpO2  01/14/23 1601 110/62 86 -- --  01/14/23 1554 (!) 87/66 81 -- --  01/14/23 1550 (!) 85/47 76 -- --  01/14/23 1546 -- -- -- 100 %  01/14/23 1544 118/65 81 -- --  01/14/23 1541 -- -- -- 100 %  01/14/23 1537 123/72 93 -- --  01/14/23 1536 -- -- -- 100 %  01/14/23 1532 123/68 89 -- --  01/14/23 1115 120/67 77 16 --    General: AAO x 3, NAD Heart: RRR Lungs:CTAB Abdomen:  Gravid, NT Extremities: no edema SVE: deferred, 3-4cm in the office this AM  FHR: 125BPM, moderate variability, + accels, no decels TOCO: Contractions irregular  Labs:   Recent Labs    01/14/23 1111  WBC 11.0*  HGB 14.4  HCT 41.9  PLT 191   Assessment/Plan: 32 y.o. G3P2002 at [redacted]w[redacted]d, early labor, GBS+  Fetal wellbeing - FHT category 1 EFW AGA 6-7lbs  Labor: Plan expectant management until second dose of PCN, then AROM, Pitocin prn  GBS positive, plan PCN per protocol Rubella immune Rh positive  Pain control: desires epidural, plan prior to AROM Analgesia/anesthesia PRN  Anticipated MOD: NSVB  Plans to breastfeed.  POC discussed with patient and support team, all questions answered.  Dr. Amado Nash notified of admission/plan of care.  June Leap CNM, MSN 01/14/2023, 4:04 PM

## 2023-01-14 NOTE — Anesthesia Procedure Notes (Signed)
Epidural Patient location during procedure: OB Start time: 01/14/2023 3:25 PM End time: 01/14/2023 3:35 PM  Staffing Anesthesiologist: Elmer Picker, MD Performed: anesthesiologist   Preanesthetic Checklist Completed: patient identified, IV checked, risks and benefits discussed, monitors and equipment checked, pre-op evaluation and timeout performed  Epidural Patient position: sitting Prep: DuraPrep and site prepped and draped Patient monitoring: continuous pulse ox, blood pressure, heart rate and cardiac monitor Approach: midline Location: L3-L4 Injection technique: LOR air  Needle:  Needle type: Tuohy  Needle gauge: 17 G Needle length: 9 cm Needle insertion depth: 5 cm Catheter type: closed end flexible Catheter size: 19 Gauge Catheter at skin depth: 10 cm Test dose: negative  Assessment Sensory level: T8 Events: blood not aspirated, no cerebrospinal fluid, injection not painful, no injection resistance, no paresthesia and negative IV test  Additional Notes Patient identified. Risks/Benefits/Options discussed with patient including but not limited to bleeding, infection, nerve damage, paralysis, failed block, incomplete pain control, headache, blood pressure changes, nausea, vomiting, reactions to medication both or allergic, itching and postpartum back pain. Confirmed with bedside nurse the patient's most recent platelet count. Confirmed with patient that they are not currently taking any anticoagulation, have any bleeding history or any family history of bleeding disorders. Patient expressed understanding and wished to proceed. All questions were answered. Sterile technique was used throughout the entire procedure. Please see nursing notes for vital signs. Test dose was given through epidural catheter and negative prior to continuing to dose epidural or start infusion. Warning signs of high block given to the patient including shortness of breath, tingling/numbness in hands,  complete motor block, or any concerning symptoms with instructions to call for help. Patient was given instructions on fall risk and not to get out of bed. All questions and concerns addressed with instructions to call with any issues or inadequate analgesia.  Reason for block:procedure for pain

## 2023-01-14 NOTE — Anesthesia Preprocedure Evaluation (Signed)
Anesthesia Evaluation  Patient identified by MRN, date of birth, ID band Patient awake    Reviewed: Allergy & Precautions, NPO status , Patient's Chart, lab work & pertinent test results  Airway Mallampati: II  TM Distance: >3 FB Neck ROM: Full    Dental no notable dental hx.    Pulmonary asthma    Pulmonary exam normal breath sounds clear to auscultation       Cardiovascular negative cardio ROS Normal cardiovascular exam Rhythm:Regular Rate:Normal     Neuro/Psych negative neurological ROS  negative psych ROS   GI/Hepatic negative GI ROS, Neg liver ROS,,,  Endo/Other  negative endocrine ROS    Renal/GU negative Renal ROS  negative genitourinary   Musculoskeletal negative musculoskeletal ROS (+)    Abdominal   Peds  Hematology negative hematology ROS (+)   Anesthesia Other Findings IOL for post dates  Reproductive/Obstetrics (+) Pregnancy                             Anesthesia Physical Anesthesia Plan  ASA: 2  Anesthesia Plan: Epidural   Post-op Pain Management:    Induction:   PONV Risk Score and Plan: Treatment may vary due to age or medical condition  Airway Management Planned: Natural Airway  Additional Equipment:   Intra-op Plan:   Post-operative Plan:   Informed Consent: I have reviewed the patients History and Physical, chart, labs and discussed the procedure including the risks, benefits and alternatives for the proposed anesthesia with the patient or authorized representative who has indicated his/her understanding and acceptance.       Plan Discussed with: Anesthesiologist  Anesthesia Plan Comments: (Patient identified. Risks, benefits, options discussed with patient including but not limited to bleeding, infection, nerve damage, paralysis, failed block, incomplete pain control, headache, blood pressure changes, nausea, vomiting, reactions to medication,  itching, and post partum back pain. Confirmed with bedside nurse the patient's most recent platelet count. Confirmed with the patient that they are not taking any anticoagulation, have any bleeding history or any family history of bleeding disorders. Patient expressed understanding and wishes to proceed. All questions were answered. )       Anesthesia Quick Evaluation

## 2023-01-15 LAB — CBC
HCT: 36.4 % (ref 36.0–46.0)
Hemoglobin: 12.6 g/dL (ref 12.0–15.0)
MCH: 33 pg (ref 26.0–34.0)
MCHC: 34.6 g/dL (ref 30.0–36.0)
MCV: 95.3 fL (ref 80.0–100.0)
Platelets: 166 10*3/uL (ref 150–400)
RBC: 3.82 MIL/uL — ABNORMAL LOW (ref 3.87–5.11)
RDW: 13.3 % (ref 11.5–15.5)
WBC: 15 10*3/uL — ABNORMAL HIGH (ref 4.0–10.5)
nRBC: 0 % (ref 0.0–0.2)

## 2023-01-15 LAB — RPR: RPR Ser Ql: NONREACTIVE

## 2023-01-15 NOTE — Progress Notes (Signed)
No c/o; ambulating, voids w/o difficulty Nml lochia Breastfeeding  Patient Vitals for the past 24 hrs:  BP Temp Temp src Pulse Resp SpO2 Height Weight  01/15/23 0845 109/67 98.6 F (37 C) Oral 73 18 -- -- --  01/15/23 0345 109/61 98 F (36.7 C) -- 81 16 97 % -- --  01/15/23 0004 117/64 98.7 F (37.1 C) Oral 72 -- 100 % -- --  01/14/23 2259 109/74 98.4 F (36.9 C) Oral 77 -- 100 % -- --  01/14/23 2203 (!) 116/58 -- -- 90 18 -- -- --  01/14/23 2146 (!) 140/80 -- -- 73 -- -- -- --  01/14/23 2132 (!) 145/77 -- -- 75 -- -- -- --  01/14/23 2116 (!) 152/80 -- -- 74 18 -- -- --  01/14/23 2101 (!) 143/87 -- -- 78 18 -- -- --  01/14/23 2052 131/88 -- -- 91 -- -- -- --  01/14/23 2031 113/73 -- -- 92 -- -- -- --  01/14/23 2002 102/69 -- -- 68 -- -- -- --  01/14/23 2001 102/69 -- -- 68 18 -- -- --  01/14/23 1935 (!) 99/51 -- -- 67 -- -- -- --  01/14/23 1932 (!) 90/27 98.2 F (36.8 C) Oral 73 18 -- -- --  01/14/23 1851 -- -- -- -- -- -- 5\' 6"  (1.676 m) 70 kg  01/14/23 1832 (!) 101/54 -- -- 75 -- -- -- --  01/14/23 1802 (!) 107/53 -- -- 69 -- -- -- --  01/14/23 1732 92/78 -- -- 80 -- -- -- --  01/14/23 1716 107/62 -- -- 74 16 -- -- --  01/14/23 1702 104/61 -- -- 77 -- -- -- --  01/14/23 1646 (!) 107/58 -- -- 74 -- -- -- --  01/14/23 1631 103/66 -- -- 77 -- -- -- --  01/14/23 1617 96/70 -- -- 67 -- -- -- --  01/14/23 1602 119/77 -- -- 95 -- -- -- --  01/14/23 1601 110/62 -- -- 86 -- -- -- --  01/14/23 1554 (!) 87/66 -- -- 81 -- -- -- --  01/14/23 1550 (!) 85/47 -- -- 76 -- -- -- --  01/14/23 1546 -- -- -- -- -- 100 % -- --  01/14/23 1544 118/65 -- -- 81 -- -- -- --  01/14/23 1541 -- -- -- -- -- 100 % -- --  01/14/23 1537 123/72 -- -- 93 -- -- -- --  01/14/23 1536 -- -- -- -- -- 100 % -- --  01/14/23 1532 123/68 -- -- 89 -- -- -- --  01/14/23 1115 120/67 -- -- 77 16 -- -- --   A&ox3 Nml respirations Abd: soft,nt,nd; fundus firm and below umb LE: no edema, nt bilat     Latest Ref  Rng & Units 01/15/2023    5:17 AM 01/14/2023   11:11 AM 09/26/2021    2:00 PM  CBC  WBC 4.0 - 10.5 K/uL 15.0  11.0  5.9   Hemoglobin 12.0 - 15.0 g/dL 16.1  09.6  04.5   Hematocrit 36.0 - 46.0 % 36.4  41.9  43.2   Platelets 150 - 400 K/uL 166  191  217    A/P: ppd1 s/p svd Doing well, contin care RH pos RI Breastfeeding, girl

## 2023-01-15 NOTE — Anesthesia Postprocedure Evaluation (Signed)
Anesthesia Post Note  Patient: Marialy B Querry  Procedure(s) Performed: AN AD HOC LABOR EPIDURAL     Patient location during evaluation: Mother Baby Anesthesia Type: Epidural Level of consciousness: awake and alert and oriented Pain management: satisfactory to patient Vital Signs Assessment: post-procedure vital signs reviewed and stable Respiratory status: respiratory function stable Cardiovascular status: stable Postop Assessment: no headache, no backache, epidural receding, patient able to bend at knees, no signs of nausea or vomiting, adequate PO intake and able to ambulate Anesthetic complications: no   No notable events documented.  Last Vitals:  Vitals:   01/15/23 0345 01/15/23 0845  BP: 109/61 109/67  Pulse: 81 73  Resp: 16 18  Temp: 36.7 C 37 C  SpO2: 97%     Last Pain:  Vitals:   01/15/23 1051  TempSrc:   PainSc: 6    Pain Goal:                   Levell Tavano

## 2023-01-15 NOTE — Lactation Note (Signed)
This note was copied from a baby's chart. Lactation Consultation Note  Patient Name: Veronica Figueroa Today's Date: 01/15/2023 Age:33 hours Reason for consult: Initial assessment  P3, Experienced with breastfeeding.  Mother states her first two child had lip and tongue ties.  Both had revisions done.  Was unable to check this infant since she was sleeping.  Mother may call for [facility] to check.  Baby has been cluster feeding.  Mother's nipples are tender and she has coconut oil.  Provided resource information sheet.  Mother to call for help as needed.  Maternal Data Has patient been taught Hand Expression?: Yes Does the patient have breastfeeding experience prior to this delivery?: Yes How long did the patient breastfeed?: 1 yr & 9 mos.  Feeding Mother's Current Feeding Choice: Breast Milk  LATCH Score Latch:  (already latched)  Audible Swallowing: None  Type of Nipple: Everted at rest and after stimulation  Comfort (Breast/Nipple): Filling, red/small blisters or bruises, mild/mod discomfort  Hold (Positioning): No assistance needed to correctly position infant at breast. Interventions Interventions: Breast feeding basics reviewed;LC Services brochure  Consult Status Consult Status: Follow-up Date: 01/16/23 Follow-up type: In-patient    Veronica Figueroa [facility] 01/15/2023, 8:39 AM

## 2023-01-15 NOTE — Social Work (Signed)
MOB was referred for history of depression/anxiety.  * Referral screened out by Clinical Social Worker because none of the following criteria appear to apply:  ~ History of anxiety/depression during this pregnancy, or of post-partum depression following prior delivery. Per OB records, no concerns noted during pregnancy.  ~ Diagnosis of anxiety and/or depression within last 3 years Per chart review diagnosis dates back to 2021. OR * MOB's symptoms currently being treated with medication and/or therapy.  Please contact the Clinical Social Worker if needs arise, or by MOB request.  Wende Neighbors, LCSWA Clinical Social Worker [phone]

## 2023-01-15 NOTE — Lactation Note (Signed)
This note was copied from a baby's chart. Lactation Consultation Note  Patient Name: Girl Karl Sharne Balough Date: 01/15/2023 Age:33 hours Reason for consult: Follow-up assessment  P3, Returned to room to observe latch.  Mother states her other two children had frenectomy and wanted LC to check. Noted short anterior lingual frenulum limiting tongue protrusion.  Provided resource sheet.    Feeding Mother's Current Feeding Choice: Breast Milk  LATCH Score Latch: Grasps breast easily, tongue down, lips flanged, rhythmical sucking.  Audible Swallowing: A few with stimulation  Type of Nipple: Everted at rest and after stimulation  Comfort (Breast/Nipple): Filling, red/small blisters or bruises, mild/mod discomfort  Hold (Positioning): No assistance needed to correctly position infant at breast.  LATCH Score: 8   Lactation Tools Discussed/Used  DEBP  Interventions Interventions: Skin to skin;Education  Discharge Discharge Education: Engorgement and breast care;Warning signs for feeding baby  Consult Status Consult Status: Follow-up Date: 01/16/23 Follow-up type: In-patient    Dahlia Byes [facility] 01/15/2023, 12:14 PM

## 2023-01-16 MED ORDER — IBUPROFEN 600 MG PO TABS: 600.0000 mg | ORAL_TABLET | Freq: Four times a day (QID) | ORAL | 0 refills | Status: DC

## 2023-01-16 MED ORDER — ACETAMINOPHEN 325 MG PO TABS: 650.0000 mg | ORAL_TABLET | ORAL | Status: DC | PRN

## 2023-01-16 NOTE — Discharge Summary (Signed)
Postpartum Discharge Summary   Patient Name: Veronica Figueroa DOB: [birth year] MRN: [id number]  Date of admission: 01/14/2023 Delivery date:01/14/2023  Delivering provider: Dorisann Frames K  Date of discharge: 01/16/2023  Admitting diagnosis: Indication for care in labor or delivery [O75.9] Intrauterine pregnancy: [redacted]w[redacted]d     Secondary diagnosis:  Principal Problem:   Postpartum care following vaginal delivery 6/13 Active Problems:   Positive GBS test   Indication for care in labor or delivery   First degree perineal laceration  Additional problems: none    Discharge diagnosis: Term Pregnancy Delivered                                              Post partum procedures:none Augmentation: AROM Complications: None  Hospital course: Onset of Labor With Vaginal Delivery      32 y.o. yo G3P3003 at [redacted]w[redacted]d was admitted in Active Labor on 01/14/2023. Labor course was complicated by none  Membrane Rupture Time/Date: 3:57 PM ,01/14/2023   Delivery Method:Vaginal, Spontaneous  Episiotomy: None  Lacerations:  1st degree;Vaginal  Patient had a postpartum course complicated by none.  She is ambulating, tolerating a regular diet, passing flatus, and urinating well. Patient is discharged home in stable condition on 01/16/23.  Newborn Data: Birth date:01/14/2023  Birth time:8:34 PM  Gender:Female  Living status:Living  Apgars:9 ,9  Weight:3740 g   Magnesium Sulfate received: No BMZ received: No Rhophylac:N/A MMR:N/A T-DaP:Given prenatally Flu: N/A Transfusion:No  Physical exam  Vitals:   01/15/23 0845 01/15/23 1159 01/15/23 1457 01/15/23 2139  BP: 109/67 (!) 109/56 111/67 118/80  Pulse: 73 69 72 77  Resp: 18 20 18 18   Temp: 98.6 F (37 C)  98.3 F (36.8 C) 98.1 F (36.7 C)  TempSrc: Oral  Oral Oral  SpO2:   100% 100%  Weight:      Height:       General: alert and cooperative Lochia: appropriate Uterine Fundus: firm Incision: Healing well with no significant drainage, No  significant erythema DVT Evaluation: Negative Homan's sign. Labs: Lab Results  Component Value Date   WBC 15.0 (H) 01/15/2023   HGB 12.6 01/15/2023   HCT 36.4 01/15/2023   MCV 95.3 01/15/2023   PLT 166 01/15/2023      Latest Ref Rng & Units 09/26/2021    2:00 PM  CMP  Glucose 70 - 99 mg/dL 75   BUN 6 - 20 mg/dL 15   Creatinine 3.24 - 1.00 mg/dL 4.01   Sodium 027 - 253 mmol/L 138   Potassium 3.5 - 5.1 mmol/L 3.7   Chloride 98 - 111 mmol/L 105   CO2 22 - 32 mmol/L 26   Calcium 8.9 - 10.3 mg/dL 9.0    Edinburgh Score:    01/14/2023   11:00 PM  Edinburgh Postnatal Depression Scale Screening Tool  I have been able to laugh and see the funny side of things. 0  I have looked forward with enjoyment to things. 0  I have blamed myself unnecessarily when things went wrong. 1  I have been anxious or worried for no good reason. 1  I have felt scared or panicky for no good reason. 0  Things have been getting on top of me. 0  I have been so unhappy that I have had difficulty sleeping. 0  I have felt sad or miserable. 0  I have  been so unhappy that I have been crying. 0  The thought of harming myself has occurred to me. 0  Edinburgh Postnatal Depression Scale Total 2      After visit meds:  Allergies as of 01/16/2023   No Known Allergies      Medication List     STOP taking these medications    cefdinir 300 MG capsule Commonly known as: OMNICEF       TAKE these medications    acetaminophen 325 MG tablet Commonly known as: Tylenol Take 2 tablets (650 mg total) by mouth every 4 (four) hours as needed (for pain scale < 4).   albuterol 108 (90 Base) MCG/ACT inhaler Commonly known as: VENTOLIN HFA INHALE 1 PUFF INTO THE LUNGS EVERY 6 HOURS AS NEEDED   cetirizine 10 MG tablet Commonly known as: ZYRTEC Take 10 mg by mouth daily as needed for allergies.   FISH OIL PO Take 1 capsule by mouth daily.   fluticasone 50 MCG/ACT nasal spray Commonly known as:  FLONASE Place 2 sprays into both nostrils daily.   ibuprofen 600 MG tablet Commonly known as: ADVIL Take 1 tablet (600 mg total) by mouth every 6 (six) hours.   prenatal multivitamin Tabs tablet Take 1 tablet by mouth daily at 12 noon.   VITAMIN D PO Take 1 capsule by mouth daily.         Discharge home in stable condition Infant Feeding: Breast Infant Disposition:home with mother Discharge instruction: per After Visit Summary and Postpartum booklet. Activity: Advance as tolerated. Pelvic rest for 6 weeks.  Diet: routine diet Anticipated Birth Control: Unsure Postpartum Appointment:6 weeks Additional Postpartum F/U:  as needed Future Appointments:No future appointments. Follow up Visit:  Follow-up Information     Christen Butter, NP .   Specialty: Nurse Practitioner Contact information: [address] [place] [address] [phone]         June Leap, CNM Follow up in 6 week(s).   Specialty: Certified Nurse Midwife Contact information: [address] [place] [address] [phone]                     01/16/2023 Robley Fries, MD

## 2023-01-16 NOTE — Lactation Note (Signed)
This note was copied from a baby's chart. Lactation Consultation Note  Patient Name: Veronica Figueroa Date: 01/16/2023 Age:33 hours Reason for consult: Follow-up assessment  P2, Baby recently breastfed for 20 min.  Discussed pumping in addition to breastfeeding.  Reviewed engorgement care and monitoring voids/stools.   Maternal Data Has patient been taught Hand Expression?: Yes  Feeding Mother's Current Feeding Choice: Breast Milk  LATCH Score Latch: Grasps breast easily, tongue down, lips flanged, rhythmical sucking.  Audible Swallowing: A few with stimulation  Type of Nipple: Everted at rest and after stimulation  Comfort (Breast/Nipple): Soft / non-tender  Hold (Positioning): No assistance needed to correctly position infant at breast.  LATCH Score: 9   Lactation Tools Discussed/Used  DEBP  Interventions Interventions: Education;DEBP  Discharge Discharge Education: Engorgement and breast care;Warning signs for feeding baby Pump: Personal  Consult Status Consult Status: Complete Date: 01/16/23    Dahlia Byes [facility] 01/16/2023, 9:38 AM

## 2023-01-16 NOTE — Progress Notes (Signed)
Post Partum Day 1, SVD Girl, G3P3  Subjective: no complaints, up ad lib, voiding, tolerating PO, and + flatus  Objective: Blood pressure 118/80, pulse 77, temperature 98.1 F (36.7 C), temperature source Oral, resp. rate 18, height 5\' 6"  (1.676 m), weight 70 kg, SpO2 100 %, unknown if currently breastfeeding.  Patient Vitals for the past 24 hrs:  BP Temp Temp src Pulse Resp SpO2  01/15/23 2139 118/80 98.1 F (36.7 C) Oral 77 18 100 %  01/15/23 1457 111/67 98.3 F (36.8 C) Oral 72 18 100 %  01/15/23 1159 (!) 109/56 -- -- 69 20 --    Physical Exam:  General: alert, cooperative, and appears stated age Lochia: appropriate Uterine Fundus: firm Incision: NA DVT Evaluation: No evidence of DVT seen on physical exam. Negative Homan's sign.     Latest Ref Rng & Units 01/15/2023    5:17 AM 01/14/2023   11:11 AM 09/26/2021    2:00 PM  CBC  WBC 4.0 - 10.5 K/uL 15.0  11.0  5.9   Hemoglobin 12.0 - 15.0 g/dL 57.8  46.9  62.9   Hematocrit 36.0 - 46.0 % 36.4  41.9  43.2   Platelets 150 - 400 K/uL 166  191  217    ABO, Rh:   A POS Antibody: NEG  Rubella: Immune      Assessment/Plan: PPD #1, SVD. G3P3. Girl Discharge home, Breastfeeding, and Lactation consult Baby awaiting Bili results PP care and call parameters dw pt  F/up CNM Jones 6 wks    LOS: 2 days   Robley Fries, MD 01/16/2023, 10:50 AM

## 2023-01-19 ENCOUNTER — Inpatient Hospital Stay (HOSPITAL_COMMUNITY): Payer: BC Managed Care – PPO

## 2023-02-01 ENCOUNTER — Telehealth (HOSPITAL_COMMUNITY): Payer: Self-pay

## 2023-02-01 NOTE — Telephone Encounter (Signed)
02/01/2023 1946  Name: Veronica Figueroa MRN: [id number] DOB: 03-Jul-1991  Reason for Call:  Transition of Care Hospital Discharge Call  Contact Status: Patient Contact Status: Message  Language assistant needed: Interpreter Mode: Interpreter Not Needed        Follow-Up Questions:    Edinburgh Postnatal Depression Scale:  In the Past 7 Days:    PHQ2-9 Depression Scale:     Discharge Follow-up:    Post-discharge interventions: NA  Signature Signe Colt

## 2023-02-25 DIAGNOSIS — Z8639 Personal history of other endocrine, nutritional and metabolic disease: Secondary | ICD-10-CM | POA: Diagnosis not present

## 2023-03-11 ENCOUNTER — Encounter: Payer: Self-pay | Admitting: Medical-Surgical

## 2023-04-14 DIAGNOSIS — Z1331 Encounter for screening for depression: Secondary | ICD-10-CM | POA: Diagnosis not present

## 2023-04-14 DIAGNOSIS — Z01419 Encounter for gynecological examination (general) (routine) without abnormal findings: Secondary | ICD-10-CM | POA: Diagnosis not present

## 2023-04-20 DIAGNOSIS — R278 Other lack of coordination: Secondary | ICD-10-CM | POA: Diagnosis not present

## 2023-04-20 DIAGNOSIS — M6281 Muscle weakness (generalized): Secondary | ICD-10-CM | POA: Diagnosis not present

## 2023-06-15 DIAGNOSIS — M6281 Muscle weakness (generalized): Secondary | ICD-10-CM | POA: Diagnosis not present

## 2023-06-15 DIAGNOSIS — R278 Other lack of coordination: Secondary | ICD-10-CM | POA: Diagnosis not present

## 2023-06-22 DIAGNOSIS — M6281 Muscle weakness (generalized): Secondary | ICD-10-CM | POA: Diagnosis not present

## 2023-06-22 DIAGNOSIS — R278 Other lack of coordination: Secondary | ICD-10-CM | POA: Diagnosis not present

## 2023-06-29 DIAGNOSIS — R278 Other lack of coordination: Secondary | ICD-10-CM | POA: Diagnosis not present

## 2023-06-29 DIAGNOSIS — M6281 Muscle weakness (generalized): Secondary | ICD-10-CM | POA: Diagnosis not present

## 2023-07-06 DIAGNOSIS — M6281 Muscle weakness (generalized): Secondary | ICD-10-CM | POA: Diagnosis not present

## 2023-07-06 DIAGNOSIS — R278 Other lack of coordination: Secondary | ICD-10-CM | POA: Diagnosis not present

## 2023-10-21 ENCOUNTER — Ambulatory Visit: Admitting: Family Medicine

## 2023-10-21 ENCOUNTER — Ambulatory Visit (INDEPENDENT_AMBULATORY_CARE_PROVIDER_SITE_OTHER): Admitting: Family Medicine

## 2023-10-21 ENCOUNTER — Encounter: Payer: Self-pay | Admitting: Family Medicine

## 2023-10-21 VITALS — BP 91/58 | HR 86 | Ht 66.0 in | Wt 147.0 lb

## 2023-10-21 DIAGNOSIS — R6889 Other general symptoms and signs: Secondary | ICD-10-CM

## 2023-10-21 DIAGNOSIS — M2550 Pain in unspecified joint: Secondary | ICD-10-CM | POA: Insufficient documentation

## 2023-10-21 LAB — POCT INFLUENZA A/B
Influenza A, POC: NEGATIVE
Influenza B, POC: NEGATIVE

## 2023-10-21 LAB — POC COVID19 BINAXNOW: SARS Coronavirus 2 Ag: NEGATIVE

## 2023-10-21 NOTE — Progress Notes (Signed)
 Veronica Figueroa - 33 y.o. female MRN [id number]  Date of birth: 1991/03/12  Subjective Chief Complaint  Patient presents with   Generalized Body Aches   Headache    HPI Veronica Figueroa is a 33 year old female here today with complaint of approximate 1 week history of arthralgias and headache.  She reports that she started menstrual cycle about 1 week ago and had headache which she felt was a migraine.  Previously she developed diffuse arthralgias.  She has continued to have some intermittent headaches.  She denies fever, chills or GI symptoms.  She has not had any rash.  She does have 3 children and reports that they have had upper respiratory symptoms recently.  There is family history of rheumatoid arthritis.  ROS:  A comprehensive ROS was completed and negative except as noted per HPI  No Known Allergies  Past Medical History:  Diagnosis Date   Asthma     Past Surgical History:  Procedure Laterality Date   LAPAROSCOPIC PARTIAL NEPHRECTOMY Right 10/08/2021   Procedure: LAPAROSCOPIC RIGHT RENAL CYST MARSUPILIZATION;  Surgeon: Crist Fat, MD;  Location: [facility];  Service: Urology;  Laterality: Right;  2 HRS FOR THIS CASE   TONSILLECTOMY     WISDOM TOOTH EXTRACTION      Social History   Socioeconomic History   Marital status: Married    Spouse name: Not on file   Number of children: Not on file   Years of education: Not on file   Highest education level: Bachelor's degree (e.g., BA, AB, BS)  Occupational History   Not on file  Tobacco Use   Smoking status: Never   Smokeless tobacco: Never  Vaping Use   Vaping status: Never Used  Substance and Sexual Activity   Alcohol use: Not Currently    Alcohol/week: 4.0 standard drinks of alcohol    Types: 4 Standard drinks or equivalent per week    Comment: Occasional   Drug use: Never   Sexual activity: Yes    Birth control/protection: None  Other Topics Concern   Not on file  Social History Narrative   Not on file    Social Drivers of Health   Financial Resource Strain: Low Risk  (10/26/2022)   Overall Financial Resource Strain (CARDIA)    Difficulty of Paying Living Expenses: Not hard at all  Food Insecurity: No Food Insecurity (01/14/2023)   Hunger Vital Sign    Worried About Running Out of Food in the Last Year: Never true    Ran Out of Food in the Last Year: Never true  Transportation Needs: No Transportation Needs (01/14/2023)   PRAPARE - Administrator, Civil Service (Medical): No    Lack of Transportation (Non-Medical): No  Physical Activity: Insufficiently Active (10/26/2022)   Exercise Vital Sign    Days of Exercise per Week: 2 days    Minutes of Exercise per Session: 20 min  Stress: No Stress Concern Present (10/26/2022)   [employer] of Occupational Health - Occupational Stress Questionnaire    Feeling of Stress : Only a little  Social Connections: Moderately Integrated (10/26/2022)   Social Connection and Isolation Panel [NHANES]    Frequency of Communication with Friends and Family: Once a week    Frequency of Social Gatherings with Friends and Family: Once a week    Attends Religious Services: More than 4 times per year    Active Member of [employer] or Organizations: Yes    Attends Banker Meetings:  More than 4 times per year    Marital Status: Married    Family History  Problem Relation Age of Onset   Heart attack Mother     Health Maintenance  Topic Date Due   Cervical Cancer Screening (HPV/Pap Cotest)  Never done   INFLUENZA VACCINE  11/01/2023 (Originally 03/04/2023)   COVID-19 Vaccine (1 - 2024-25 season) 01/06/2024 (Originally 04/04/2023)   DTaP/Tdap/Td (2 - Td or Tdap) 10/13/2028   Hepatitis C Screening  Completed   HIV Screening  Completed   HPV VACCINES  Aged Out      ----------------------------------------------------------------------------------------------------------------------------------------------------------------------------------------------------------------- Physical Exam BP (!) 91/58 (BP Location: Left Arm, Patient Position: Sitting, Cuff Size: Small)   Pulse 86   Ht 5\' 6"  (1.676 m)   Wt 147 lb (66.7 kg)   SpO2 98%   BMI 23.73 kg/m   Physical Exam Constitutional:      Appearance: Normal appearance.  HENT:     Head: Normocephalic and atraumatic.  Eyes:     General: No scleral icterus. Cardiovascular:     Rate and Rhythm: Normal rate and regular rhythm.  Pulmonary:     Effort: Pulmonary effort is normal.     Breath sounds: Normal breath sounds.  Musculoskeletal:     Cervical back: Neck supple.     Comments: No significant joint swelling or effusions noted.  Skin:    Findings: No rash.  Neurological:     General: No focal deficit present.     Mental Status: She is alert.  Psychiatric:        Mood and Affect: Mood normal.        Behavior: Behavior normal.     ------------------------------------------------------------------------------------------------------------------------------------------------------------------------------------------------------------------- Assessment and Plan  Arthralgia Point-of-care testing for influenza and COVID are negative.  Differential includes inflammatory etiology, infectious etiology and or metabolic cause.  She does have young children who have been ill recently.  Checking parvo B19 titers,, EBV serology, inflammatory markers, CBC, TSH and metabolic panel.   No orders of the defined types were placed in this encounter.   No follow-ups on file.    This visit occurred during the SARS-CoV-2 public health emergency.  Safety protocols were in place, including screening questions prior to the visit, additional usage of staff PPE, and extensive cleaning of exam room while  observing appropriate contact time as indicated for disinfecting solutions.

## 2023-10-21 NOTE — Assessment & Plan Note (Signed)
 Point-of-care testing for influenza and COVID are negative.  Differential includes inflammatory etiology, infectious etiology and or metabolic cause.  She does have young children who have been ill recently.  Checking parvo B19 titers,, EBV serology, inflammatory markers, CBC, TSH and metabolic panel.

## 2023-10-22 LAB — EBV AB TO VIRAL CAPSID AG PNL, IGG+IGM
EBV VCA IgG: 205 U/mL — ABNORMAL HIGH (ref 0.0–17.9)
EBV VCA IgM: <36 U/mL (ref 0.0–35.9)

## 2023-10-22 LAB — CBC WITH DIFFERENTIAL/PLATELET
Basophils Absolute: 0 10*3/uL (ref 0.0–0.2)
Basos: 1 %
EOS (ABSOLUTE): 0.2 10*3/uL (ref 0.0–0.4)
Eos: 4 %
Hematocrit: 44.5 % (ref 34.0–46.6)
Hemoglobin: 15.1 g/dL (ref 11.1–15.9)
Immature Grans (Abs): 0 10*3/uL (ref 0.0–0.1)
Immature Granulocytes: 0 %
Lymphocytes Absolute: 1.9 10*3/uL (ref 0.7–3.1)
Lymphs: 45 %
MCH: 31.3 pg (ref 26.6–33.0)
MCHC: 33.9 g/dL (ref 31.5–35.7)
MCV: 92 fL (ref 79–97)
Monocytes Absolute: 0.3 10*3/uL (ref 0.1–0.9)
Monocytes: 8 %
Neutrophils Absolute: 1.7 10*3/uL (ref 1.4–7.0)
Neutrophils: 42 %
Platelets: 139 10*3/uL — ABNORMAL LOW (ref 150–450)
RBC: 4.82 x10E6/uL (ref 3.77–5.28)
RDW: 11.8 % (ref 11.7–15.4)
WBC: 4.1 10*3/uL (ref 3.4–10.8)

## 2023-10-22 LAB — CMP14+EGFR
ALT: 8 IU/L (ref 0–32)
AST: 21 IU/L (ref 0–40)
Albumin: 4.3 g/dL (ref 3.9–4.9)
Alkaline Phosphatase: 60 IU/L (ref 44–121)
BUN/Creatinine Ratio: 17 (ref 9–23)
BUN: 12 mg/dL (ref 6–20)
Bilirubin Total: 1 mg/dL (ref 0.0–1.2)
CO2: 26 mmol/L (ref 20–29)
Calcium: 8.8 mg/dL (ref 8.7–10.2)
Chloride: 104 mmol/L (ref 96–106)
Creatinine, Ser: 0.72 mg/dL (ref 0.57–1.00)
Globulin, Total: 2.1 g/dL (ref 1.5–4.5)
Glucose: 98 mg/dL (ref 70–99)
Potassium: 4 mmol/L (ref 3.5–5.2)
Sodium: 139 mmol/L (ref 134–144)
Total Protein: 6.4 g/dL (ref 6.0–8.5)
eGFR: 113 mL/min/{1.73_m2} (ref >59)

## 2023-10-22 LAB — CK: Total CK: 70 U/L (ref 32–182)

## 2023-10-22 LAB — PARVOVIRUS B19 ANTIBODY, IGG AND IGM
Parvovirus B19 IgG: 3.4 {index} — ABNORMAL HIGH (ref 0.0–0.8)
Parvovirus B19 IgM: 0.2 {index} (ref 0.0–0.8)

## 2023-10-22 LAB — TSH: TSH: 1.72 u[IU]/mL (ref 0.450–4.500)

## 2023-10-22 LAB — SEDIMENTATION RATE: Sed Rate: 4 mm/h (ref 0–32)

## 2023-10-22 LAB — C-REACTIVE PROTEIN: CRP: 2 mg/L (ref 0–10)

## 2023-10-25 ENCOUNTER — Encounter: Payer: Self-pay | Admitting: Family Medicine

## 2024-01-03 DIAGNOSIS — J209 Acute bronchitis, unspecified: Secondary | ICD-10-CM | POA: Diagnosis not present

## 2024-01-03 DIAGNOSIS — J029 Acute pharyngitis, unspecified: Secondary | ICD-10-CM | POA: Diagnosis not present

## 2024-01-03 DIAGNOSIS — Z20822 Contact with and (suspected) exposure to covid-19: Secondary | ICD-10-CM | POA: Diagnosis not present

## 2024-01-03 DIAGNOSIS — R5383 Other fatigue: Secondary | ICD-10-CM | POA: Diagnosis not present

## 2024-01-04 ENCOUNTER — Ambulatory Visit: Payer: Self-pay

## 2024-01-04 DIAGNOSIS — R748 Abnormal levels of other serum enzymes: Secondary | ICD-10-CM | POA: Diagnosis not present

## 2024-01-04 DIAGNOSIS — N281 Cyst of kidney, acquired: Secondary | ICD-10-CM | POA: Diagnosis not present

## 2024-01-04 DIAGNOSIS — R1010 Upper abdominal pain, unspecified: Secondary | ICD-10-CM | POA: Diagnosis not present

## 2024-01-04 DIAGNOSIS — M545 Low back pain, unspecified: Secondary | ICD-10-CM | POA: Diagnosis not present

## 2024-01-04 DIAGNOSIS — R7401 Elevation of levels of liver transaminase levels: Secondary | ICD-10-CM | POA: Diagnosis not present

## 2024-01-04 DIAGNOSIS — N3000 Acute cystitis without hematuria: Secondary | ICD-10-CM | POA: Diagnosis not present

## 2024-01-04 DIAGNOSIS — R1033 Periumbilical pain: Secondary | ICD-10-CM | POA: Diagnosis not present

## 2024-01-04 DIAGNOSIS — M546 Pain in thoracic spine: Secondary | ICD-10-CM | POA: Diagnosis not present

## 2024-01-04 DIAGNOSIS — R109 Unspecified abdominal pain: Secondary | ICD-10-CM | POA: Diagnosis not present

## 2024-01-04 DIAGNOSIS — Q6102 Congenital multiple renal cysts: Secondary | ICD-10-CM | POA: Diagnosis not present

## 2024-01-04 DIAGNOSIS — N200 Calculus of kidney: Secondary | ICD-10-CM | POA: Diagnosis not present

## 2024-01-04 NOTE — Telephone Encounter (Signed)
 Reason for Disposition . [1] SEVERE pain (e.g., excruciating) AND [2] present > 1 hour  Answer Assessment - Initial Assessment Questions 1. LOCATION: "Where does it hurt?"      Right and left abdomen 2. RADIATION: "Does the pain shoot anywhere else?" (e.g., chest, back)     ------ 3. ONSET: "When did the pain begin?" (e.g., minutes, hours or days ago)      3-4 AM this morning 4. SUDDEN: "Gradual or sudden onset?"     ---- 5. PATTERN "Does the pain come and go, or is it constant?"    - If it comes and goes: "How long does it last?" "Do you have pain now?"     (Note: Comes and goes means the pain is intermittent. It goes away completely between bouts.)    - If constant: "Is it getting better, staying the same, or getting worse?"      (Note: Constant means the pain never goes away completely; most serious pain is constant and gets worse.)      constant 6. SEVERITY: "How bad is the pain?"  (e.g., Scale 1-10; mild, moderate, or severe)    - MILD (1-3): Doesn't interfere with normal activities, abdomen soft and not tender to touch.     - MODERATE (4-7): Interferes with normal activities or awakens from sleep, abdomen tender to touch.     - SEVERE (8-10): Excruciating pain, doubled over, unable to do any normal activities.       7-8 7. RECURRENT SYMPTOM: "Have you ever had this type of stomach pain before?" If Yes, ask: "When was the last time?" and "What happened that time?"      ---- 8. CAUSE: "What do you think is causing the stomach pain?"     unsure 9. RELIEVING/AGGRAVATING FACTORS: "What makes it better or worse?" (e.g., antacids, bending or twisting motion, bowel movement)     Can't get comfortable 10. OTHER SYMPTOMS: "Do you have any other symptoms?" (e.g., back pain, diarrhea, fever, urination pain, vomiting)       diarrhea 11. PREGNANCY: "Is there any chance you are pregnant?" "When was your last menstrual period?"       No---last cycle a few weeks ago  Protocols used: Abdominal  Pain - [facility]

## 2024-01-04 NOTE — Telephone Encounter (Signed)
 Spoke w/pt and she informed me that she was pulling up to the ED.

## 2024-01-04 NOTE — Telephone Encounter (Addendum)
 Copied from CRM [id number]. Topic: Clinical - Red Word Triage >> Jan 04, 2024  1:54 PM Brittney F wrote: [facility] that prompted transfer to Nurse Triage:   Severe pain; pain in abdomen and lower back   Believes she has a kidney stone  Started 3-4am today     Chief Complaint: Lower Back Pain/Abdominal Pain Symptoms: Pain Frequency: since 3-4 AM today Pertinent Negatives: Patient denies vomiting, fever, blood in urine, pain with urination, weakness, numbness, or problems with bowel/bladder control Disposition: [x] ED /[] Urgent Care (no appt availability in office) / [] Appointment(In office/virtual)/ []  [facility]/ [] Home Care/ [] Refused Recommended Disposition /[] [facility]/ []  Follow-up with PCP Additional Notes: Patient called and advised that she went to an Urgent Care last night and nothing significant was found. These are what she was told at Urgent Care last night: Strep, Mono, Flu, Covid all negative Chest X ray nothing abnormal found Blood work--nothing urgently abnormal found Ibuprofen  and Dayquil---nothing helping   Patient states that the back pain and abdominal pain were not present when she went to the Urgent Care last night.  7-9 out of 10 back pain Patient also states she is having abdomen pain that is severe. Patient sounds unwell. She is advised that the recommendation at this time for her severe pain is to go to the Emergency Room. Patient verbalized understanding.  Reason for Disposition  [1] SEVERE back pain (e.g., excruciating, unable to do any normal activities) AND [2] not improved 2 hours after pain medicine  Answer Assessment - Initial Assessment Questions 1. ONSET: "When did the pain begin?"      Since 3-4 am today 2. LOCATION: "Where does it hurt?" (upper, mid or lower back)     Lower back and upwards as well 3. SEVERITY: "How bad is the pain?"  (e.g., Scale 1-10; mild, moderate, or severe)   - MILD (1-3): Doesn't interfere with  normal activities.    - MODERATE (4-7): Interferes with normal activities or awakens from sleep.    - SEVERE (8-10): Excruciating pain, unable to do any normal activities.      7-9 4. PATTERN: "Is the pain constant?" (e.g., yes, no; constant, intermittent)      yes 5. RADIATION: "Does the pain shoot into your legs or somewhere else?"     N/a 6. CAUSE:  "What do you think is causing the back pain?"      unsure 7. BACK OVERUSE:  "Any recent lifting of heavy objects, strenuous work or exercise?"     --- 8. MEDICINES: "What have you taken so far for the pain?" (e.g., nothing, acetaminophen , NSAIDS)     Ibuprofen  9. NEUROLOGIC SYMPTOMS: "Do you have any weakness, numbness, or problems with bowel/bladder control?"     No 10. OTHER SYMPTOMS: "Do you have any other symptoms?" (e.g., fever, abdomen pain, burning with urination, blood in urine)       Abdomen Pain 11. PREGNANCY: "Is there any chance you are pregnant?" "When was your last menstrual period?"       No---last cycle three weeks ago  Protocols used: Back Pain-[facility]

## 2024-01-06 DIAGNOSIS — R7989 Other specified abnormal findings of blood chemistry: Secondary | ICD-10-CM | POA: Diagnosis not present

## 2024-01-25 DIAGNOSIS — N3001 Acute cystitis with hematuria: Secondary | ICD-10-CM | POA: Diagnosis not present

## 2024-02-01 DIAGNOSIS — R7989 Other specified abnormal findings of blood chemistry: Secondary | ICD-10-CM | POA: Diagnosis not present

## 2024-02-09 DIAGNOSIS — B3731 Acute candidiasis of vulva and vagina: Secondary | ICD-10-CM | POA: Diagnosis not present

## 2024-02-09 DIAGNOSIS — R300 Dysuria: Secondary | ICD-10-CM | POA: Diagnosis not present

## 2024-02-28 DIAGNOSIS — Z1331 Encounter for screening for depression: Secondary | ICD-10-CM | POA: Diagnosis not present

## 2024-02-28 DIAGNOSIS — R7989 Other specified abnormal findings of blood chemistry: Secondary | ICD-10-CM | POA: Diagnosis not present

## 2024-02-28 DIAGNOSIS — E611 Iron deficiency: Secondary | ICD-10-CM | POA: Diagnosis not present

## 2024-02-28 DIAGNOSIS — J45990 Exercise induced bronchospasm: Secondary | ICD-10-CM | POA: Diagnosis not present

## 2024-02-28 DIAGNOSIS — R5383 Other fatigue: Secondary | ICD-10-CM | POA: Diagnosis not present

## 2024-04-04 ENCOUNTER — Encounter: Payer: Self-pay | Admitting: Sports Medicine

## 2024-05-02 DIAGNOSIS — Z2821 Immunization not carried out because of patient refusal: Secondary | ICD-10-CM | POA: Diagnosis not present

## 2024-05-02 DIAGNOSIS — M79601 Pain in right arm: Secondary | ICD-10-CM | POA: Diagnosis not present

## 2024-05-02 DIAGNOSIS — Z13220 Encounter for screening for lipoid disorders: Secondary | ICD-10-CM | POA: Diagnosis not present

## 2024-05-02 DIAGNOSIS — Z23 Encounter for immunization: Secondary | ICD-10-CM | POA: Diagnosis not present

## 2024-05-02 DIAGNOSIS — Z8261 Family history of arthritis: Secondary | ICD-10-CM | POA: Diagnosis not present

## 2024-05-02 DIAGNOSIS — Z0000 Encounter for general adult medical examination without abnormal findings: Secondary | ICD-10-CM | POA: Diagnosis not present

## 2024-06-13 DIAGNOSIS — N6320 Unspecified lump in the left breast, unspecified quadrant: Secondary | ICD-10-CM | POA: Diagnosis not present

## 2024-06-28 ENCOUNTER — Ambulatory Visit (INDEPENDENT_AMBULATORY_CARE_PROVIDER_SITE_OTHER): Admitting: Medical-Surgical

## 2024-06-28 ENCOUNTER — Encounter: Payer: Self-pay | Admitting: Medical-Surgical

## 2024-06-28 VITALS — BP 95/65 | HR 84 | Resp 20 | Ht 66.0 in | Wt 143.8 lb

## 2024-06-28 DIAGNOSIS — Z202 Contact with and (suspected) exposure to infections with a predominantly sexual mode of transmission: Secondary | ICD-10-CM | POA: Diagnosis not present

## 2024-06-28 NOTE — Progress Notes (Signed)
        Established patient visit   History of Present Illness   Discussed the use of AI scribe software for clinical note transcription with the patient, who gave verbal consent to proceed.  History of Present Illness   Veronica Figueroa is a 32 year old female who presents for STD testing following concerns of infidelity.  Sexually transmitted infection exposure - Concern for potential exposure to sexually transmitted infections following her SO's infidelity in August 2025 - Currently asymptomatic - In a monogamous relationship with one partner - Recently initiated condom use; previously did not use condoms  Psychological well-being - Feels safe at home - No thoughts of self-harm or harm to others       Physical Exam   Physical Exam Vitals reviewed.  Constitutional:      General: She is not in acute distress.    Appearance: Normal appearance.  HENT:     Head: Normocephalic and atraumatic.  Cardiovascular:     Rate and Rhythm: Normal rate and regular rhythm.     Heart sounds:     Friction rub present.  Pulmonary:     Effort: Pulmonary effort is normal. No respiratory distress.  Skin:    General: Skin is warm and dry.  Neurological:     Mental Status: She is alert and oriented to person, place, and time.  Psychiatric:        Mood and Affect: Mood normal. Affect is tearful.        Behavior: Behavior normal.        Thought Content: Thought content normal.        Judgment: Judgment normal.    Assessment & Plan     Possible exposure to sexually transmitted infections She requested STD testing after potential exposure. Asymptomatic, in a monogamous relationship with consistent condom use.  - Ordered blood tests for HIV, hepatitis B, hepatitis C, and syphilis. - Ordered urine tests for gonorrhea, chlamydia, and trichomonas. - Obtain wet prep for bacterial vaginosis.     Follow up   Return if symptoms worsen or fail to improve. __________________________________ Zada FREDRIK Palin, DNP, APRN, FNP-BC Primary Care and Sports Medicine [facility]

## 2024-06-28 NOTE — Addendum Note (Signed)
 Addended by: FANNY NIELS CROME on: 06/28/2024 10:13 AM   Modules accepted: Orders

## 2024-06-29 ENCOUNTER — Ambulatory Visit: Payer: Self-pay | Admitting: Medical-Surgical

## 2024-06-29 LAB — STI PROFILE
HCV Ab: NONREACTIVE
HIV Screen 4th Generation wRfx: NONREACTIVE
Hep B Core Total Ab: NEGATIVE
Hep B Surface Ab, Qual: NONREACTIVE
Hepatitis B Surface Ag: NEGATIVE
RPR Ser Ql: NONREACTIVE

## 2024-06-29 LAB — WET PREP FOR TRICH, YEAST, CLUE
Clue Cell Exam: NEGATIVE
Trichomonas Exam: NEGATIVE
Yeast Exam: NEGATIVE

## 2024-06-29 LAB — HCV INTERPRETATION

## 2024-06-30 LAB — CHLAMYDIA/GONOCOCCUS/TRICHOMONAS, NAA
Chlamydia by NAA: NEGATIVE
Gonococcus by NAA: NEGATIVE
Trich vag by NAA: NEGATIVE

## 2024-07-10 DIAGNOSIS — R92342 Mammographic extreme density, left breast: Secondary | ICD-10-CM | POA: Diagnosis not present

## 2024-07-10 DIAGNOSIS — R922 Inconclusive mammogram: Secondary | ICD-10-CM | POA: Diagnosis not present

## 2024-07-10 DIAGNOSIS — R92343 Mammographic extreme density, bilateral breasts: Secondary | ICD-10-CM | POA: Diagnosis not present

## 2024-07-10 DIAGNOSIS — N6012 Diffuse cystic mastopathy of left breast: Secondary | ICD-10-CM | POA: Diagnosis not present
